# Patient Record
Sex: Male | Born: 1960
Health system: Southern US, Community
[De-identification: ages and names within clinical notes are randomized; demographics above are authoritative.]

## PROBLEM LIST (undated history)

## (undated) DIAGNOSIS — Z9289 Personal history of other medical treatment: Secondary | ICD-10-CM

## (undated) DIAGNOSIS — K219 Gastro-esophageal reflux disease without esophagitis: Secondary | ICD-10-CM

## (undated) DIAGNOSIS — M5430 Sciatica, unspecified side: Secondary | ICD-10-CM

## (undated) DIAGNOSIS — E785 Hyperlipidemia, unspecified: Secondary | ICD-10-CM

## (undated) DIAGNOSIS — G4733 Obstructive sleep apnea (adult) (pediatric): Secondary | ICD-10-CM

## (undated) HISTORY — DX: Gastro-esophageal reflux disease without esophagitis: K21.9

## (undated) HISTORY — DX: Personal history of other medical treatment: Z92.89

## (undated) HISTORY — PX: APPENDECTOMY: SHX54

## (undated) HISTORY — DX: Hyperlipidemia, unspecified: E78.5

## (undated) HISTORY — PX: LEG SURGERY: SHX1003

## (undated) HISTORY — DX: Obstructive sleep apnea (adult) (pediatric): G47.33

## (undated) HISTORY — DX: Sciatica, unspecified side: M54.30

---

## 2000-06-12 ENCOUNTER — Encounter: Admission: RE | Admit: 2000-06-12 | Discharge: 2000-09-10 | Payer: Self-pay | Admitting: *Deleted

## 2001-06-20 ENCOUNTER — Emergency Department (HOSPITAL_COMMUNITY): Admission: EM | Admit: 2001-06-20 | Discharge: 2001-06-20 | Payer: Self-pay | Admitting: Emergency Medicine

## 2003-10-28 ENCOUNTER — Ambulatory Visit (HOSPITAL_COMMUNITY): Admission: RE | Admit: 2003-10-28 | Discharge: 2003-10-28 | Payer: Self-pay | Admitting: Internal Medicine

## 2004-09-11 ENCOUNTER — Ambulatory Visit (HOSPITAL_COMMUNITY): Admission: RE | Admit: 2004-09-11 | Discharge: 2004-09-11 | Payer: Self-pay | Admitting: Internal Medicine

## 2004-10-25 ENCOUNTER — Ambulatory Visit (HOSPITAL_COMMUNITY): Admission: RE | Admit: 2004-10-25 | Discharge: 2004-10-25 | Payer: Self-pay | Admitting: Internal Medicine

## 2009-04-12 ENCOUNTER — Encounter: Payer: Self-pay | Admitting: Cardiology

## 2009-04-28 ENCOUNTER — Ambulatory Visit: Payer: Self-pay | Admitting: Cardiology

## 2009-04-28 DIAGNOSIS — E785 Hyperlipidemia, unspecified: Secondary | ICD-10-CM | POA: Insufficient documentation

## 2009-05-09 ENCOUNTER — Telehealth (INDEPENDENT_AMBULATORY_CARE_PROVIDER_SITE_OTHER): Payer: Self-pay

## 2009-05-10 ENCOUNTER — Ambulatory Visit: Payer: Self-pay

## 2009-05-10 ENCOUNTER — Ambulatory Visit: Payer: Self-pay | Admitting: Internal Medicine

## 2009-05-10 ENCOUNTER — Encounter (HOSPITAL_COMMUNITY): Admission: RE | Admit: 2009-05-10 | Discharge: 2009-07-12 | Payer: Self-pay | Admitting: Cardiology

## 2010-04-11 NOTE — Assessment & Plan Note (Signed)
Summary: Cardiology Nuclear Study  Nuclear Med Background Indications for Stress Test: Evaluation for Ischemia   History: Myocardial Perfusion Study  History Comments: 10 yrs ago: ok per patient.  Symptoms: Chest Pain, Chest Pain with Exertion    Nuclear Pre-Procedure Cardiac Risk Factors: Family History - CAD, History of Smoking, Lipids, NIDDM, RBBB Caffeine/Decaff Intake: None NPO After: 8:00 PM Lungs: clear IV 0.9% NS with Angio Cath: 22g     IV Site: (R) Hand IV Started by: Irean Hong RN Chest Size (in) 44     Height (in): 70 Weight (lb): 213 BMI: 30.67  Nuclear Med Study 1 or 2 day study:  1 day     Stress Test Type:  Stress Reading MD:  Arvilla Meres, MD     Referring MD:  D.Mclean Resting Radionuclide:  Technetium 73m Tetrofosmin     Resting Radionuclide Dose:  11.0 mCi  Stress Radionuclide:  Technetium 51m Tetrofosmin     Stress Radionuclide Dose:  33.0 mCi   Stress Protocol Exercise Time (min):  07:10 min     Max HR:  157 bpm     Predicted Max HR:  170 bpm  Max Systolic BP: 178 mm Hg     Percent Max HR:  92.35 %     METS: 8.8 Rate Pressure Product:  54098    Stress Test Technologist:  Milana Na EMT-P     Nuclear Technologist:  Burna Mortimer Deal RT-N  Rest Procedure  Myocardial perfusion imaging was performed at rest 45 minutes following the intravenous administration of Myoview Technetium 63m Tetrofosmin.  Stress Procedure  The patient exercised for 7:10. The patient stopped due to fatigue and denied any chest pain.  There were no significant ST-T wave changes.  Myoview was injected at peak exercise and myocardial perfusion imaging was performed after a brief delay.  QPS Raw Data Images:  Normal; no motion artifact; normal heart/lung ratio. Stress Images:  There is normal uptake in all areas. Rest Images:  Normal homogeneous uptake in all areas of the myocardium. Subtraction (SDS):  Normal Transient Ischemic Dilatation:  1.01  (Normal <1.22)  Lung/Heart Ratio:  .33  (Normal <0.45)  Quantitative Gated Spect Images QGS EDV:  92 ml QGS ESV:  29 ml QGS EF:  68 % QGS cine images:  Normal  Findings Normal nuclear study      Overall Impression  Exercise Capacity: Fair exercise capacity. ECG Impression: No significant ST segment change suggestive of ischemia. Overall Impression: Normal stress nuclear study. Overall Impression Comments: Normal.  Appended Document: Cardiology Nuclear Study normal  Appended Document: Cardiology Nuclear Study LMVM  Appended Document: Cardiology Nuclear Study discussed results with pt by telephone

## 2010-04-11 NOTE — Progress Notes (Signed)
Summary: Nuc. Pre-Procedure  Phone Note Outgoing Call Call back at [facility] Phone [phone]   Call placed by: Irean Hong, RN,  May 09, 2009 3:44 PM Summary of Call: Reviewed information on Myoview Information Sheet (see scanned document for further details).  Spoke with patient.     Nuclear Med Background Indications for Stress Test: Evaluation for Ischemia   History: Myocardial Perfusion Study  History Comments: 10 yrs ago: ok per patient.  Symptoms: Chest Pain, Chest Pain with Exertion    Nuclear Pre-Procedure Cardiac Risk Factors: Family History - CAD, History of Smoking, Lipids, NIDDM, RBBB Height (in): 70

## 2010-04-11 NOTE — Letter (Signed)
Summary: [facility]  [facility]   Imported By: Roderic Ovens 06/13/2009 13:53:28  _____________________________________________________________________  External Attachment:    Type:   Image     Comment:   External Document

## 2010-04-11 NOTE — Assessment & Plan Note (Signed)
Summary: np6/family hx of MI early 60's/cp/dm   Primary Provider:  Dr. Elisabeth Most   History of Present Illness: 50 yo with history of OSA, hyperlipidemia, and diet-controlled diabetes presents for evaluation of chest pain.  He is concerned because both his brother and sister have had MIs.  He gets crampy pain in left central chest.  Nothing in particular triggers this (sometimes with exertion, sometimes at rest).  Pain will last about 2 minutes at a time.  It has been going on for about a year.  He gets episodes every couple of weeks.  Of note, patient does walk 3-5 miles on "gazelle" machine several times a week with no chest pain or dyspnea.  He can climb steps and do manual labor, etc, without exertional shortness of breath.    ECG: NSR, iRBBB  Labs (2/11): K 3.9, creatinine 1.48, LDL 45, HDL 43, TSH normal  Current Medications (verified): 1)  Simvastatin 20 Mg Tabs (Simvastatin) .Marland Kitchen.. 1 By Mouth Daily 2)  Nexium 40 Mg Cpdr (Esomeprazole Magnesium) .Marland Kitchen.. 1 By Mouth As Needed 3)  Androgel Pump 1 % Gel (Testosterone) .... As Directed 4)  Aspirin 81 Mg Tbec (Aspirin) .... One Tablet Daily  Allergies (verified): No Known Drug Allergies  Past History:  Past Medical History: 1. CKD: Most recent creatinine 1.48. 2. Hyperlipidemia 3. Low testosterone 4. Sciatica 5. GERD 6. OSA 7. diet-controlled diabetes 8. ETT-cardiolite > 10 years ago was normal per patient's report.   Family History: Father with MI in his 42s, brother with MI at 66, sister with MI in her 38s  Social History: Quit smoking in 1988.  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  Married, lives in [place].   Review of Systems       All systems reviewed and negative except as per HPI.   Vital Signs:  Patient profile:   50 year old male Height:      70 inches Weight:      215 pounds BMI:     30.96 Pulse rate:   84 / minute Resp:     16 per minute BP sitting:   124 / 92  (right arm)  Vitals Entered By: Marrion Coy, CNA  (April 28, 2009 2:08 PM)  Physical Exam  General:  Well developed, well nourished, in no acute distress. Head:  normocephalic and atraumatic Nose:  no deformity, discharge, inflammation, or lesions Mouth:  Teeth, gums and palate normal. Oral mucosa normal. Neck:  Neck supple, no JVD. No masses, thyromegaly or abnormal cervical nodes. Lungs:  Clear bilaterally to auscultation and percussion. Heart:  Non-displaced PMI, chest non-tender; regular rate and rhythm, S1, S2 without murmurs, rubs or gallops. Carotid upstroke normal, no bruit.  Pedals normal pulses. No edema, no varicosities. Abdomen:  Bowel sounds positive; abdomen soft and non-tender without masses, organomegaly, or hernias noted. No hepatosplenomegaly. Msk:  Back normal, normal gait. Muscle strength and tone normal. Extremities:  No clubbing or cyanosis. Neurologic:  Alert and oriented x 3. Skin:  Intact without lesions or rashes. Psych:  Normal affect.   Impression & Recommendations:  Problem # 1:  CHEST PAIN UNSPECIFIED (ICD-786.50) Atypical chest pain in a patient with multiple risk factors, including hyperlipidemia, OSA, diabetes, and family history of CAD.  I will have him start ASA 81 mg daily and will risk stratify him with an ETT-myoview.    Problem # 2:  HYPERLIPIDEMIA-MIXED (ICD-272.4) Excellent lipids when checked this month. Continue Zocor.   Other Orders: Nuclear Stress Test (Nuc Stress Test)  Patient Instructions: 1)  Your physician has recommended you make the following change in your medication:  2)  Start Aspirin 81mg  daily--this should be buffered or coated 3)  Your physician has requested that you have an exercise stress myoview.  For further information please visit [email or web address]  Please follow instruction sheet, as given. 4)  Your physician recommends that you schedule a follow-up appointment as needed with Dr Rylynn Schoneman Shirlee Latch

## 2010-10-24 ENCOUNTER — Encounter: Payer: Self-pay | Admitting: Cardiology

## 2011-11-15 ENCOUNTER — Emergency Department (HOSPITAL_BASED_OUTPATIENT_CLINIC_OR_DEPARTMENT_OTHER): Payer: No Typology Code available for payment source

## 2011-11-15 ENCOUNTER — Emergency Department (HOSPITAL_BASED_OUTPATIENT_CLINIC_OR_DEPARTMENT_OTHER)
Admission: EM | Admit: 2011-11-15 | Discharge: 2011-11-15 | Disposition: A | Discharge status: Home / Self Care | Payer: No Typology Code available for payment source | Attending: Emergency Medicine | Admitting: Emergency Medicine

## 2011-11-15 ENCOUNTER — Encounter (HOSPITAL_BASED_OUTPATIENT_CLINIC_OR_DEPARTMENT_OTHER): Payer: Self-pay | Admitting: Emergency Medicine

## 2011-11-15 DIAGNOSIS — N189 Chronic kidney disease, unspecified: Secondary | ICD-10-CM | POA: Insufficient documentation

## 2011-11-15 DIAGNOSIS — S335XXA Sprain of ligaments of lumbar spine, initial encounter: Secondary | ICD-10-CM | POA: Insufficient documentation

## 2011-11-15 DIAGNOSIS — Z8249 Family history of ischemic heart disease and other diseases of the circulatory system: Secondary | ICD-10-CM | POA: Insufficient documentation

## 2011-11-15 DIAGNOSIS — G4733 Obstructive sleep apnea (adult) (pediatric): Secondary | ICD-10-CM | POA: Insufficient documentation

## 2011-11-15 DIAGNOSIS — S161XXA Strain of muscle, fascia and tendon at neck level, initial encounter: Secondary | ICD-10-CM

## 2011-11-15 DIAGNOSIS — S39012A Strain of muscle, fascia and tendon of lower back, initial encounter: Secondary | ICD-10-CM

## 2011-11-15 DIAGNOSIS — E119 Type 2 diabetes mellitus without complications: Secondary | ICD-10-CM | POA: Insufficient documentation

## 2011-11-15 DIAGNOSIS — S139XXA Sprain of joints and ligaments of unspecified parts of neck, initial encounter: Secondary | ICD-10-CM | POA: Insufficient documentation

## 2011-11-15 DIAGNOSIS — K219 Gastro-esophageal reflux disease without esophagitis: Secondary | ICD-10-CM | POA: Insufficient documentation

## 2011-11-15 MED ORDER — HYDROCODONE-ACETAMINOPHEN 5-325 MG PO TABS
2.0000 | ORAL_TABLET | Freq: Once | ORAL | Status: AC
  Administered 2011-11-15: 2 via ORAL
  Filled 2011-11-15: qty 2

## 2011-11-15 MED ORDER — HYDROCODONE-ACETAMINOPHEN 5-325 MG PO TABS: 2.0000 | ORAL_TABLET | ORAL | Status: AC | PRN
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
## 2011-11-24 NOTE — ED Provider Notes (Signed)
Medical screening examination/treatment/procedure(s) were performed by non-physician practitioner and as supervising physician I was immediately available for consultation/collaboration.  Kei Mcelhiney, MD 11/24/11 1437 

## 2013-05-27 ENCOUNTER — Encounter: Payer: Self-pay | Admitting: Internal Medicine

## 2013-06-03 ENCOUNTER — Encounter: Payer: Self-pay | Admitting: Internal Medicine

## 2013-06-03 ENCOUNTER — Ambulatory Visit (INDEPENDENT_AMBULATORY_CARE_PROVIDER_SITE_OTHER): Payer: PRIVATE HEALTH INSURANCE | Admitting: Internal Medicine

## 2013-06-03 VITALS — BP 114/76 | HR 92 | Temp 98.1°F | Resp 16 | Ht 70.0 in | Wt 238.6 lb

## 2013-06-03 DIAGNOSIS — J019 Acute sinusitis, unspecified: Secondary | ICD-10-CM

## 2013-06-03 DIAGNOSIS — E119 Type 2 diabetes mellitus without complications: Secondary | ICD-10-CM

## 2013-06-03 DIAGNOSIS — K2100 Gastro-esophageal reflux disease with esophagitis, without bleeding: Secondary | ICD-10-CM

## 2013-06-03 DIAGNOSIS — E559 Vitamin D deficiency, unspecified: Secondary | ICD-10-CM

## 2013-06-03 DIAGNOSIS — G4733 Obstructive sleep apnea (adult) (pediatric): Secondary | ICD-10-CM

## 2013-06-03 DIAGNOSIS — E669 Obesity, unspecified: Secondary | ICD-10-CM

## 2013-06-03 DIAGNOSIS — Z9989 Dependence on other enabling machines and devices: Secondary | ICD-10-CM

## 2013-06-03 DIAGNOSIS — E785 Hyperlipidemia, unspecified: Secondary | ICD-10-CM

## 2013-06-03 DIAGNOSIS — I10 Essential (primary) hypertension: Secondary | ICD-10-CM

## 2013-06-03 DIAGNOSIS — J029 Acute pharyngitis, unspecified: Secondary | ICD-10-CM

## 2013-06-03 DIAGNOSIS — E349 Endocrine disorder, unspecified: Secondary | ICD-10-CM

## 2013-06-03 MED ORDER — AZITHROMYCIN 250 MG PO TABS: ORAL_TABLET | ORAL | Status: AC

## 2013-06-03 MED ORDER — HYDROCODONE-ACETAMINOPHEN 5-325 MG PO TABS: ORAL_TABLET | ORAL | Status: DC

## 2013-06-03 MED ORDER — PREDNISONE 20 MG PO TABS: 20.0000 mg | ORAL_TABLET | ORAL | Status: DC

## 2013-06-03 NOTE — Progress Notes (Signed)
Subjective:    Patient ID: Erik Salas, male    DOB: 02/01/1961, [name] y.o.   MRN: [id number]  Cough This is a new problem. The current episode started in the past 7 days. The problem has been waxing and waning. The problem occurs constantly. The cough is non-productive. Associated symptoms include chills, a fever, headaches, nasal congestion, rhinorrhea, a sore throat and sweats. Pertinent negatives include no chest pain, ear congestion, ear pain, heartburn, hemoptysis, myalgias, postnasal drip, rash, shortness of breath, weight loss or wheezing. He has tried OTC cough suppressant for the symptoms. The treatment provided no relief.  Sinusitis Associated symptoms include chills, congestion, coughing, headaches, sinus pressure and a sore throat. Pertinent negatives include no diaphoresis, ear pain or shortness of breath.  Sore Throat  Associated symptoms include congestion, coughing and headaches. Pertinent negatives include no drooling, ear discharge, ear pain or shortness of breath.    Current Outpatient Prescriptions on File Prior to Visit  Medication Sig Dispense Refill  . aspirin 81 MG EC tablet Take 81 mg by mouth daily.        Marland Kitchen. esomeprazole (NEXIUM) 40 MG capsule Take 40 mg by mouth daily as needed.        . simvastatin (ZOCOR) 20 MG tablet Take 20 mg by mouth at bedtime.         No current facility-administered medications on file prior to visit.   No Known Allergies Past Medical History  Diagnosis Date  . CKD (chronic kidney disease)     most recent creatinine 1.48  . Hyperlipidemia   . Low testosterone   . Sciatica   . GERD (gastroesophageal reflux disease)   . OSA (obstructive sleep apnea)   . DM (diabetes mellitus), gestational, diet controlled   . History of ETT     cardiolite > 10 years ago was normal per patient's report    Review of Systems  Constitutional: Positive for fever and chills. Negative for weight loss, diaphoresis and fatigue.  HENT: Positive for  congestion, rhinorrhea, sinus pressure and sore throat. Negative for dental problem, drooling, ear discharge, ear pain, facial swelling, hearing loss, mouth sores, nosebleeds, postnasal drip and tinnitus.        C/o sinus pressure and pain.  Eyes: Negative.   Respiratory: Positive for cough. Negative for hemoptysis, shortness of breath and wheezing.   Cardiovascular: Negative.  Negative for chest pain.  Gastrointestinal: Negative.  Negative for heartburn.  Musculoskeletal: Negative for myalgias.  Skin: Negative for rash.  Neurological: Positive for headaches. Negative for dizziness, tremors, facial asymmetry, speech difficulty, weakness, light-headedness and numbness.   BP 114/76  Pulse 92  Temp(Src) 98.1 F (36.7 C) (Temporal)  Resp 16  Ht 5\' 10"  (1.778 m)  Wt 238 lb 9.6 oz (108.228 kg)  BMI 34.24 kg/m2    Objective:   Physical Exam  Constitutional: He is oriented to person, place, and time. No distress.  HENT:  Head: Normocephalic and atraumatic.  Right Ear: External ear normal.  Left Ear: External ear normal.  Nose: Nose normal.  Mouth/Throat: Oropharynx is clear and moist. No oropharyngeal exudate.  Bilat Frontal and maxillary tenderness. Oropharynx is sl red w/o exudate.  Eyes: Pupils are equal, round, and reactive to light. Right eye exhibits discharge. Left eye exhibits no discharge.  Neck: Normal range of motion. Neck supple. No JVD present. No thyromegaly present.  Cardiovascular: Normal rate, regular rhythm and normal heart sounds.   No murmur heard. Pulmonary/Chest: Effort normal and breath sounds  normal. No respiratory distress. He has no wheezes. He has no rales.  Abdominal: Soft. Bowel sounds are normal.  Musculoskeletal: Normal range of motion.  Lymphadenopathy:    He has no cervical adenopathy.  Neurological: He is alert and oriented to person, place, and time. No cranial nerve deficit. Coordination normal.  Skin: Skin is warm and dry. No rash noted. He is not  diaphoretic. No erythema. No pallor.   Assessment & Plan:  1. Acute sinusitis, unspecified  - azithromycin (ZITHROMAX) 250 MG tablet; Take 2 tablets (500 mg) on  Day 1,  followed by 1 tablet (250 mg) once daily on Days 2 through 5.  Dispense: 6 each; Refill: 1 - HYDROcodone-acetaminophen (NORCO) 5-325 MG per tablet; 1/2 to 1 tablet every 3 to 4 hours as  needed for cough or pain  Dispense: 50 tablet; Refill: 0 - predniSONE (DELTASONE) 20 MG tablet; Take 1 tablet (20 mg total) by mouth See admin instructions. 1 tab 3 x day for 3 days, then 1 tab 2 x day for 3 days, then 1 tab 1 x day for 5 days  Dispense: 20 tablet; Refill: 0  2. Acute pharyngitis

## 2013-06-03 NOTE — Patient Instructions (Signed)

## 2013-06-29 ENCOUNTER — Other Ambulatory Visit: Payer: Self-pay | Admitting: Internal Medicine

## 2013-07-21 ENCOUNTER — Other Ambulatory Visit: Payer: Self-pay | Admitting: Internal Medicine

## 2013-07-21 DIAGNOSIS — E119 Type 2 diabetes mellitus without complications: Secondary | ICD-10-CM

## 2013-07-21 MED ORDER — GLUCOSE BLOOD VI STRP: ORAL_STRIP | Status: DC

## 2013-08-06 ENCOUNTER — Encounter: Payer: Self-pay | Admitting: Internal Medicine

## 2013-09-14 ENCOUNTER — Encounter: Payer: Self-pay | Admitting: Internal Medicine

## 2013-10-27 ENCOUNTER — Encounter: Payer: Self-pay | Admitting: Internal Medicine

## 2013-10-27 ENCOUNTER — Ambulatory Visit (INDEPENDENT_AMBULATORY_CARE_PROVIDER_SITE_OTHER): Payer: 59 | Admitting: Internal Medicine

## 2013-10-27 VITALS — BP 112/72 | HR 76 | Temp 97.9°F | Resp 16 | Ht 70.0 in | Wt 198.2 lb

## 2013-10-27 DIAGNOSIS — Z9989 Dependence on other enabling machines and devices: Secondary | ICD-10-CM

## 2013-10-27 DIAGNOSIS — E559 Vitamin D deficiency, unspecified: Secondary | ICD-10-CM

## 2013-10-27 DIAGNOSIS — G4733 Obstructive sleep apnea (adult) (pediatric): Secondary | ICD-10-CM

## 2013-10-27 DIAGNOSIS — Z0000 Encounter for general adult medical examination without abnormal findings: Secondary | ICD-10-CM

## 2013-10-27 DIAGNOSIS — R7401 Elevation of levels of liver transaminase levels: Secondary | ICD-10-CM

## 2013-10-27 DIAGNOSIS — E349 Endocrine disorder, unspecified: Secondary | ICD-10-CM

## 2013-10-27 DIAGNOSIS — E119 Type 2 diabetes mellitus without complications: Secondary | ICD-10-CM

## 2013-10-27 DIAGNOSIS — R740 Nonspecific elevation of levels of transaminase and lactic acid dehydrogenase [LDH]: Secondary | ICD-10-CM

## 2013-10-27 DIAGNOSIS — E782 Mixed hyperlipidemia: Secondary | ICD-10-CM

## 2013-10-27 DIAGNOSIS — R7309 Other abnormal glucose: Secondary | ICD-10-CM

## 2013-10-27 DIAGNOSIS — K2100 Gastro-esophageal reflux disease with esophagitis, without bleeding: Secondary | ICD-10-CM

## 2013-10-27 DIAGNOSIS — Z113 Encounter for screening for infections with a predominantly sexual mode of transmission: Secondary | ICD-10-CM

## 2013-10-27 DIAGNOSIS — I10 Essential (primary) hypertension: Secondary | ICD-10-CM

## 2013-10-27 DIAGNOSIS — E669 Obesity, unspecified: Secondary | ICD-10-CM

## 2013-10-27 DIAGNOSIS — Z1212 Encounter for screening for malignant neoplasm of rectum: Secondary | ICD-10-CM

## 2013-10-27 DIAGNOSIS — Z125 Encounter for screening for malignant neoplasm of prostate: Secondary | ICD-10-CM

## 2013-10-27 DIAGNOSIS — E785 Hyperlipidemia, unspecified: Secondary | ICD-10-CM

## 2013-10-27 DIAGNOSIS — Z111 Encounter for screening for respiratory tuberculosis: Secondary | ICD-10-CM

## 2013-10-27 DIAGNOSIS — R7303 Prediabetes: Secondary | ICD-10-CM | POA: Insufficient documentation

## 2013-10-27 DIAGNOSIS — R7402 Elevation of levels of lactic acid dehydrogenase (LDH): Secondary | ICD-10-CM

## 2013-10-27 LAB — CBC WITH DIFFERENTIAL/PLATELET
BASOS ABS: 0.1 10*3/uL (ref 0.0–0.1)
BASOS PCT: 1 % (ref 0–1)
Eosinophils Absolute: 0.1 10*3/uL (ref 0.0–0.7)
Eosinophils Relative: 2 % (ref 0–5)
HEMATOCRIT: 46.1 % (ref 39.0–52.0)
HEMOGLOBIN: 16.1 g/dL (ref 13.0–17.0)
Lymphocytes Relative: 36 % (ref 12–46)
Lymphs Abs: 2.2 10*3/uL (ref 0.7–4.0)
MCH: 31 pg (ref 26.0–34.0)
MCHC: 34.9 g/dL (ref 30.0–36.0)
MCV: 88.8 fL (ref 78.0–100.0)
Monocytes Absolute: 0.6 10*3/uL (ref 0.1–1.0)
Monocytes Relative: 10 % (ref 3–12)
NEUTROS PCT: 51 % (ref 43–77)
Neutro Abs: 3.1 10*3/uL (ref 1.7–7.7)
PLATELETS: 170 10*3/uL (ref 150–400)
RBC: 5.19 MIL/uL (ref 4.22–5.81)
RDW: 14.3 % (ref 11.5–15.5)
WBC: 6.1 10*3/uL (ref 4.0–10.5)

## 2013-10-27 NOTE — Progress Notes (Signed)
Patient ID: Erik Salas, male   DOB: 17-Jan-1961, 53 y.o.   MRN: [id number]   Annual Screening Comprehensive Examination  This very nice 52 y.o.male presents for complete physical.  Patient has been followed for HTN, T2_NIDDM, Hyperlipidemia, and Vitamin D Deficiency. Patient has followed Dr Francis Dowse Fuhrman's "Eat to Live" diet and started exercising wand he has lost 44# in the last year with his BMI dropping from 36 to his current 28.4 . He reports that he has stopped his CPAP with no recurrance of Sleep Apnea. gerd sX'S HAVE IMPROVED WITH ONLY INFREQUENT USE OF HIS nEXIUM.    Labile HTN predates since 2013 an dhas been monitored expectantly.  Patient's BP has been controlled at home.Today's BP: 112/72 mmHg. Patient denies any cardiac symptoms as chest pain, palpitations, shortness of breath, dizziness or ankle swelling.   Patient's hyperlipidemia is controlled with diet and medications. Patient denies myalgias or other medication SE's. Last lipids were Chol 225, TG 264, HDL 64 and LDL 42 in Mar 2014.    Patient has prediabetes since 2012 with A1c 5.7  and patient denies reactive hypoglycemic symptoms, visual blurring, diabetic polys or paresthesias. Last A1c was 5.6% in Mar 2014.    Finally, patient has history of Vitamin D Deficiency of 44 in 2009 and last vitamin D was 45 in Mar 2014.   Medication Sig  . aspirin 81 MG EC tablet Take 81 mg by mouth daily.    Marland Kitchen esomeprazole (NEXIUM) 40 MG capsule Take 40 mg by mouth daily as needed.    Marland Kitchen glucose blood test strip Use as instructed (One Touch Ultra Teststrips) - check glucose tid  . simvastatin (ZOCOR) 40 MG tablet TAKE ONE TABLET BY MOUTH AT BEDTIME   No Known Allergies  Past Medical History  Diagnosis Date  . CKD (chronic kidney disease)     most recent creatinine 1.48  . Hyperlipidemia   . Low testosterone   . Sciatica   . GERD (gastroesophageal reflux disease)   . OSA (obstructive sleep apnea)   . DM (diabetes mellitus), gestational,  diet controlled   . History of ETT     cardiolite > 10 years ago was normal per patient's report   Past Surgical History  Procedure Laterality Date  . Appendectomy     Family History  Problem Relation Age of Onset  . Heart attack Father 9  . Heart attack Brother 67  . Heart attack Sister 69   History   Social History  . Marital Status: Married    Spouse Name: [name]    Number of Children: [name]  . Years of Education: [name]   Occupational History  . Parts Manager     [employer]   Social History Main Topics  . Smoking status: Former Smoker    Quit date: 03/12/1986  . Smokeless tobacco: Not on file  . Alcohol Use: Yes     Comment: ocassional  . Drug Use: Not on file  . Sexual Activity: Not on file   Social History Narrative   Lives in [place]    ROS Constitutional: Denies fever, chills, weight loss/gain, headaches, insomnia, fatigue, night sweats or change in appetite. Eyes: Denies redness, blurred vision, diplopia, discharge, itchy or watery eyes.  ENT: Denies discharge, congestion, post nasal drip, epistaxis, sore throat, earache, hearing loss, dental pain, Tinnitus, Vertigo, Sinus pain or snoring.  Cardio: Denies chest pain, palpitations, irregular heartbeat, syncope, dyspnea, diaphoresis, orthopnea, PND, claudication or edema Respiratory: denies cough, dyspnea, DOE, pleurisy,  hoarseness, laryngitis or wheezing.  Gastrointestinal: Denies dysphagia, heartburn, reflux, water brash, pain, cramps, nausea, vomiting, bloating, diarrhea, constipation, hematemesis, melena, hematochezia, jaundice or hemorrhoids Genitourinary: Denies dysuria, frequency, urgency, nocturia, hesitancy, discharge, hematuria or flank pain Musculoskeletal: Denies arthralgia, myalgia, stiffness, Jt. Swelling, pain, limp or strain/sprain. Denies Falls. Skin: Denies puritis, rash, hives, warts, acne, eczema or change in skin lesion Neuro: No weakness, tremor, incoordination, spasms, paresthesia or  pain Psychiatric: Denies confusion, memory loss or sensory loss. Denies Depression. Endocrine: Denies change in weight, skin, hair change, nocturia, and paresthesia, diabetic polys, visual blurring or hyper / hypo glycemic episodes.  Heme/Lymph: No excessive bleeding, bruising or enlarged lymph nodes.  Physical Exam  BP 112/72  Pulse 76  Temp(Src) 97.9 F (36.6 C) (Temporal)  Resp 16  Ht 5\' 10"  (1.778 m)  Wt 198 lb 3.2 oz (89.903 kg)  BMI 28.44 kg/m2  General Appearance: Well nourished, in no apparent distress. Eyes: PERRLA, EOMs, conjunctiva no swelling or erythema, normal fundi and vessels. Sinuses: No frontal/maxillary tenderness ENT/Mouth: EACs patent / TMs  nl. Nares clear without erythema, swelling, mucoid exudates. Oral hygiene is good. No erythema, swelling, or exudate. Tongue normal, non-obstructing. Tonsils not swollen or erythematous. Hearing normal.  Neck: Supple, thyroid normal. No bruits, nodes or JVD. Respiratory: Respiratory effort normal.  BS equal and clear bilateral without rales, rhonci, wheezing or stridor. Cardio: Heart sounds are normal with regular rate and rhythm and no murmurs, rubs or gallops. Peripheral pulses are normal and equal bilaterally without edema. No aortic or femoral bruits. Chest: symmetric with normal excursions and percussion.  Abdomen: Flat, soft, with bowl sounds. Nontender, no guarding, rebound, hernias, masses, or organomegaly.  Lymphatics: Non tender without lymphadenopathy.  Genitourinary: No hernias.Testes nl. DRE - prostate nl for age - smooth & firm w/o nodules. Musculoskeletal: Full ROM all peripheral extremities, joint stability, 5/5 strength, and normal gait. Skin: Warm and dry without rashes, lesions, cyanosis, clubbing or  ecchymosis.  Neuro: Cranial nerves intact, reflexes equal bilaterally. Normal muscle tone, no cerebellar symptoms. Sensation intact.  Pysch: Awake and oriented X 3with normal affect, insight and judgment  appropriate.  Assessment and Plan  1. Annual Screening Examination 2. Labile Hypertension, Hx 3. Hyperlipidemia 4. PreDiabetes 5. Vitamin D Deficiency 6. OSA/CPAP - off Tx  7. MORBID OBESITY (BMI 36 -> 28.4)  Continue prudent diet as discussed, weight control, BP monitoring, regular exercise, and medications as discussed.  Discussed med effects and SE's. Routine screening labs and tests as requested with regular follow-up as recommended.

## 2013-10-27 NOTE — Patient Instructions (Signed)
Recommend the book "The END of DIETING" by Dr Baker Janus   and the book "The END of DIABETES " by Dr Excell Seltzer  At [email or web address] - get book & Audio CD's      Being diabetic has a  300% increased risk for heart attack, stroke, cancer, and alzheimer- type vascular dementia. It is very important that you work harder with diet by avoiding all foods that are white except chicken & fish. Avoid white rice (brown & wild rice is OK), white potatoes (sweetpotatoes in moderation is OK), White bread or wheat bread or anything made out of white flour like bagels, donuts, rolls, buns, biscuits, cakes, pastries, cookies, pizza crust, and pasta (made from white flour & egg whites) - vegetarian pasta or spinach or wheat pasta is OK. Multigrain breads like Arnold's or [employer], or multigrain sandwich thins or flatbreads.  Diet, exercise and weight loss can reverse and cure diabetes in the early stages.  Diet, exercise and weight loss is very important in the control and prevention of complications of diabetes which affects every system in your body, ie. Brain - dementia/stroke, eyes - glaucoma/blindness, heart - heart attack/heart failure, kidneys - dialysis, stomach - gastric paralysis, intestines - malabsorption, nerves - severe painful neuritis, circulation - gangrene & loss of a leg(s), and finally cancer and Alzheimers.    I recommend avoid fried & greasy foods,  sweets/candy, white rice (brown or wild rice or Quinoa is OK), white potatoes (sweet potatoes are OK) - anything made from white flour - bagels, doughnuts, rolls, buns, biscuits,white and wheat breads, pizza crust and traditional pasta made of white flour & egg white(vegetarian pasta or spinach or wheat pasta is OK).  Multi-grain bread is OK - like multi-grain flat bread or sandwich thins. Avoid alcohol in excess. Exercise is also important.    Eat all the vegetables you want - avoid meat, especially red meat and dairy - especially cheese.  Cheese  is the most concentrated form of trans-fats which is the worst thing to clog up our arteries. Veggie cheese is OK which can be found in the fresh produce section at [employer] or [employer] or [employer]  Preventive Care for Adults A healthy lifestyle and preventive care can promote health and wellness. Preventive health guidelines for men include the following key practices:  A routine yearly physical is a good way to check with your health care provider about your health and preventative screening. It is a chance to share any concerns and updates on your health and to receive a thorough exam.  Visit your dentist for a routine exam and preventative care every 6 months. Brush your teeth twice a day and floss once a day. Good oral hygiene prevents tooth decay and gum disease.  The frequency of eye exams is based on your age, health, family medical history, use of contact lenses, and other factors. Follow your health care provider's recommendations for frequency of eye exams.  Eat a healthy diet. Foods such as vegetables, fruits, whole grains, low-fat dairy products, and lean protein foods contain the nutrients you need without too many calories. Decrease your intake of foods high in solid fats, added sugars, and salt. Eat the right amount of calories for you.Get information about a proper diet from your health care provider, if necessary.  Regular physical exercise is one of the most important things you can do for your health. Most adults should get at least 150 minutes of moderate-intensity exercise (any activity that  increases your heart rate and causes you to sweat) each week. In addition, most adults need muscle-strengthening exercises on 2 or more days a week.  Maintain a healthy weight. The body mass index (BMI) is a screening tool to identify possible weight problems. It provides an estimate of body fat based on height and weight. Your health care provider can find your BMI and can help you  achieve or maintain a healthy weight.For adults 20 years and older:  A BMI below 18.5 is considered underweight.  A BMI of 18.5 to 24.9 is normal.  A BMI of 25 to 29.9 is considered overweight.  A BMI of 30 and above is considered obese.  Maintain normal blood lipids and cholesterol levels by exercising and minimizing your intake of saturated fat. Eat a balanced diet with plenty of fruit and vegetables. Blood tests for lipids and cholesterol should begin at age 20 and be repeated every 5 years. If your lipid or cholesterol levels are high, you are over 50, or you are at high risk for heart disease, you may need your cholesterol levels checked more frequently.Ongoing high lipid and cholesterol levels should be treated with medicines if diet and exercise are not working.  If you smoke, find out from your health care provider how to quit. If you do not use tobacco, do not start.  Lung cancer screening is recommended for adults aged 72-80 years who are at high risk for developing lung cancer because of a history of smoking. A yearly low-dose CT scan of the lungs is recommended for people who have at least a 30-pack-year history of smoking and are a current smoker or have quit within the past 15 years. A pack year of smoking is smoking an average of 1 pack of cigarettes a day for 1 year (for example: 1 pack a day for 30 years or 2 packs a day for 15 years). Yearly screening should continue until the smoker has stopped smoking for at least 15 years. Yearly screening should be stopped for people who develop a health problem that would prevent them from having lung cancer treatment.  If you choose to drink alcohol, do not have more than 2 drinks per day. One drink is considered to be 12 ounces (355 mL) of beer, 5 ounces (148 mL) of wine, or 1.5 ounces (44 mL) of liquor.  Avoid use of street drugs. Do not share needles with anyone. Ask for help if you need support or instructions about stopping the use of  drugs.  High blood pressure causes heart disease and increases the risk of stroke. Your blood pressure should be checked at least every 1-2 years. Ongoing high blood pressure should be treated with medicines, if weight loss and exercise are not effective.  If you are 28-64 years old, ask your health care provider if you should take aspirin to prevent heart disease.  Diabetes screening involves taking a blood sample to check your fasting blood sugar level. This should be done once every 3 years, after age 13, if you are within normal weight and without risk factors for diabetes. Testing should be considered at a younger age or be carried out more frequently if you are overweight and have at least 1 risk factor for diabetes.  Colorectal cancer can be detected and often prevented. Most routine colorectal cancer screening begins at the age of 78 and continues through age 56. However, your health care provider may recommend screening at an earlier age if you have risk  factors for colon cancer. On a yearly basis, your health care provider may provide home test kits to check for hidden blood in the stool. Use of a small camera at the end of a tube to directly examine the colon (sigmoidoscopy or colonoscopy) can detect the earliest forms of colorectal cancer. Talk to your health care provider about this at age 48, when routine screening begins. Direct exam of the colon should be repeated every 5-10 years through age 60, unless early forms of precancerous polyps or small growths are found.  People who are at an increased risk for hepatitis B should be screened for this virus. You are considered at high risk for hepatitis B if:  You were born in a country where hepatitis B occurs often. Talk with your health care provider about which countries are considered high risk.  Your parents were born in a high-risk country and you have not received a shot to protect against hepatitis B (hepatitis B vaccine).  You have  HIV or AIDS.  You use needles to inject street drugs.  You live with, or have sex with, someone who has hepatitis B.  You are a man who has sex with other men (MSM).  You get hemodialysis treatment.  You take certain medicines for conditions such as cancer, organ transplantation, and autoimmune conditions.  Hepatitis C blood testing is recommended for all people born from [birth year] through 1965 and any individual with known risks for hepatitis C.  Practice safe sex. Use condoms and avoid high-risk sexual practices to reduce the spread of sexually transmitted infections (STIs). STIs include gonorrhea, chlamydia, syphilis, trichomonas, herpes, HPV, and human immunodeficiency virus (HIV). Herpes, HIV, and HPV are viral illnesses that have no cure. They can result in disability, cancer, and death.  If you are at risk of being infected with HIV, it is recommended that you take a prescription medicine daily to prevent HIV infection. This is called preexposure prophylaxis (PrEP). You are considered at risk if:  You are a man who has sex with other men (MSM) and have other risk factors.  You are a heterosexual man, are sexually active, and are at increased risk for HIV infection.  You take drugs by injection.  You are sexually active with a partner who has HIV.  Talk with your health care provider about whether you are at high risk of being infected with HIV. If you choose to begin PrEP, you should first be tested for HIV. You should then be tested every 3 months for as long as you are taking PrEP.  A one-time screening for abdominal aortic aneurysm (AAA) and surgical repair of large AAAs by ultrasound are recommended for men ages 51 to 11 years who are current or former smokers.  Healthy men should no longer receive prostate-specific antigen (PSA) blood tests as part of routine cancer screening. Talk with your health care provider about prostate cancer screening.  Testicular cancer screening is  not recommended for adult males who have no symptoms. Screening includes self-exam, a health care provider exam, and other screening tests. Consult with your health care provider about any symptoms you have or any concerns you have about testicular cancer.  Use sunscreen. Apply sunscreen liberally and repeatedly throughout the day. You should seek shade when your shadow is shorter than you. Protect yourself by wearing long sleeves, pants, a wide-brimmed hat, and sunglasses year round, whenever you are outdoors.  Once a month, do a whole-body skin exam, using a mirror to look  at the skin on your back. Tell your health care provider about new moles, moles that have irregular borders, moles that are larger than a pencil eraser, or moles that have changed in shape or color.  Stay current with required vaccines (immunizations).  Influenza vaccine. All adults should be immunized every year.  Tetanus, diphtheria, and acellular pertussis (Td, Tdap) vaccine. An adult who has not previously received Tdap or who does not know his vaccine status should receive 1 dose of Tdap. This initial dose should be followed by tetanus and diphtheria toxoids (Td) booster doses every 10 years. Adults with an unknown or incomplete history of completing a 3-dose immunization series with Td-containing vaccines should begin or complete a primary immunization series including a Tdap dose. Adults should receive a Td booster every 10 years.  Varicella vaccine. An adult without evidence of immunity to varicella should receive 2 doses or a second dose if he has previously received 1 dose.  Human papillomavirus (HPV) vaccine. Males aged 29-21 years who have not received the vaccine previously should receive the 3-dose series. Males aged 22-26 years may be immunized. Immunization is recommended through the age of 26 years for any male who has sex with males and did not get any or all doses earlier. Immunization is recommended for any  person with an immunocompromised condition through the age of 29 years if he did not get any or all doses earlier. During the 3-dose series, the second dose should be obtained 4-8 weeks after the first dose. The third dose should be obtained 24 weeks after the first dose and 16 weeks after the second dose.  Zoster vaccine. One dose is recommended for adults aged 38 years or older unless certain conditions are present.  Measles, mumps, and rubella (MMR) vaccine. Adults born before [birth year] generally are considered immune to measles and mumps. Adults born in [birth year] or later should have 1 or more doses of MMR vaccine unless there is a contraindication to the vaccine or there is laboratory evidence of immunity to each of the three diseases. A routine second dose of MMR vaccine should be obtained at least 28 days after the first dose for students attending postsecondary schools, health care workers, or international travelers. People who received inactivated measles vaccine or an unknown type of measles vaccine during 1963-1967 should receive 2 doses of MMR vaccine. People who received inactivated mumps vaccine or an unknown type of mumps vaccine before 1979 and are at high risk for mumps infection should consider immunization with 2 doses of MMR vaccine. Unvaccinated health care workers born before [birth year] who lack laboratory evidence of measles, mumps, or rubella immunity or laboratory confirmation of disease should consider measles and mumps immunization with 2 doses of MMR vaccine or rubella immunization with 1 dose of MMR vaccine.  Pneumococcal 13-valent conjugate (PCV13) vaccine. When indicated, a person who is uncertain of his immunization history and has no record of immunization should receive the PCV13 vaccine. An adult aged 68 years or older who has certain medical conditions and has not been previously immunized should receive 1 dose of PCV13 vaccine. This PCV13 should be followed with a dose of pneumococcal  polysaccharide (PPSV23) vaccine. The PPSV23 vaccine dose should be obtained at least 8 weeks after the dose of PCV13 vaccine. An adult aged 95 years or older who has certain medical conditions and previously received 1 or more doses of PPSV23 vaccine should receive 1 dose of PCV13. The PCV13 vaccine dose should be obtained 1  or more years after the last PPSV23 vaccine dose.  Pneumococcal polysaccharide (PPSV23) vaccine. When PCV13 is also indicated, PCV13 should be obtained first. All adults aged 10 years and older should be immunized. An adult younger than age 43 years who has certain medical conditions should be immunized. Any person who resides in a nursing home or long-term care facility should be immunized. An adult smoker should be immunized. People with an immunocompromised condition and certain other conditions should receive both PCV13 and PPSV23 vaccines. People with human immunodeficiency virus (HIV) infection should be immunized as soon as possible after diagnosis. Immunization during chemotherapy or radiation therapy should be avoided. Routine use of PPSV23 vaccine is not recommended for American Indians, [place]atives, or people younger than 65 years unless there are medical conditions that require PPSV23 vaccine. When indicated, people who have unknown immunization and have no record of immunization should receive PPSV23 vaccine. One-time revaccination 5 years after the first dose of PPSV23 is recommended for people aged 19-64 years who have chronic kidney failure, nephrotic syndrome, asplenia, or immunocompromised conditions. People who received 1-2 doses of PPSV23 before age 43 years should receive another dose of PPSV23 vaccine at age 43 years or later if at least 5 years have passed since the previous dose. Doses of PPSV23 are not needed for people immunized with PPSV23 at or after age 63 years.  Meningococcal vaccine. Adults with asplenia or persistent complement component deficiencies  should receive 2 doses of quadrivalent meningococcal conjugate (MenACWY-D) vaccine. The doses should be obtained at least 2 months apart. Microbiologists working with certain meningococcal bacteria, [place] recruits, people at risk during an outbreak, and people who travel to or live in countries with a high rate of meningitis should be immunized. A first-year college student up through age 70 years who is living in a residence hall should receive a dose if he did not receive a dose on or after his 16th birthday. Adults who have certain high-risk conditions should receive one or more doses of vaccine.  Hepatitis A vaccine. Adults who wish to be protected from this disease, have certain high-risk conditions, work with hepatitis A-infected animals, work in hepatitis A research labs, or travel to or work in countries with a high rate of hepatitis A should be immunized. Adults who were previously unvaccinated and who anticipate close contact with an international adoptee during the first 60 days after arrival in the [place] from a country with a high rate of hepatitis A should be immunized.  Hepatitis B vaccine. Adults should be immunized if they wish to be protected from this disease, have certain high-risk conditions, may be exposed to blood or other infectious body fluids, are household contacts or sex partners of hepatitis B positive people, are clients or workers in certain care facilities, or travel to or work in countries with a high rate of hepatitis B.  Haemophilus influenzae type b (Hib) vaccine. A previously unvaccinated person with asplenia or sickle cell disease or having a scheduled splenectomy should receive 1 dose of Hib vaccine. Regardless of previous immunization, a recipient of a hematopoietic stem cell transplant should receive a 3-dose series 6-12 months after his successful transplant. Hib vaccine is not recommended for adults with HIV infection. Preventive Service / Frequency Ages  76 to 70  Blood pressure check.** / Every 1 to 2 years.  Lipid and cholesterol check.** / Every 5 years beginning at age 32.  Lung cancer screening. / Every year if you are aged 85-80  years and have a 30-pack-year history of smoking and currently smoke or have quit within the past 15 years. Yearly screening is stopped once you have quit smoking for at least 15 years or develop a health problem that would prevent you from having lung cancer treatment.  Fecal occult blood test (FOBT) of stool. / Every year beginning at age 50 and continuing until age 69. You may not have to do this test if you get a colonoscopy every 10 years.  Flexible sigmoidoscopy** or colonoscopy.** / Every 5 years for a flexible sigmoidoscopy or every 10 years for a colonoscopy beginning at age 19 and continuing until age 73.  Hepatitis C blood test.** / For all people born from [birth year] through 1965 and any individual with known risks for hepatitis C.  Skin self-exam. / Monthly.  Influenza vaccine. / Every year.  Tetanus, diphtheria, and acellular pertussis (Tdap/Td) vaccine.** / Consult your health care provider. 1 dose of Td every 10 years.  Varicella vaccine.** / Consult your health care provider.  Zoster vaccine.** / 1 dose for adults aged 53 years or older.  Measles, mumps, rubella (MMR) vaccine.** / You need at least 1 dose of MMR if you were born in 1957 or later. You may also need a second dose.  Pneumococcal 13-valent conjugate (PCV13) vaccine.** / Consult your health care provider.  Pneumococcal polysaccharide (PPSV23) vaccine.** / 1 to 2 doses if you smoke cigarettes or if you have certain conditions.  Meningococcal vaccine.** / Consult your health care provider.  Hepatitis A vaccine.** / Consult your health care provider.  Hepatitis B vaccine.** / Consult your health care provider.  Haemophilus influenzae type b (Hib) vaccine.** / Consult your health care provider.

## 2013-10-28 LAB — HEPATITIS C ANTIBODY: HCV AB: NEGATIVE

## 2013-10-28 LAB — IRON AND TIBC
%SAT: 25 % (ref 20–55)
IRON: 90 ug/dL (ref 42–165)
TIBC: 355 ug/dL (ref 215–435)
UIBC: 265 ug/dL (ref 125–400)

## 2013-10-28 LAB — HEPATITIS B CORE ANTIBODY, TOTAL: HEP B C TOTAL AB: NONREACTIVE

## 2013-10-28 LAB — HEPATIC FUNCTION PANEL
ALK PHOS: 139 U/L — AB (ref 39–117)
ALT: 18 U/L (ref 0–53)
AST: 22 U/L (ref 0–37)
Albumin: 4.8 g/dL (ref 3.5–5.2)
BILIRUBIN TOTAL: 1 mg/dL (ref 0.2–1.2)
Bilirubin, Direct: 0.2 mg/dL (ref 0.0–0.3)
Indirect Bilirubin: 0.8 mg/dL (ref 0.2–1.2)
Total Protein: 7.5 g/dL (ref 6.0–8.3)

## 2013-10-28 LAB — BASIC METABOLIC PANEL WITH GFR
BUN: 14 mg/dL (ref 6–23)
CALCIUM: 9.8 mg/dL (ref 8.4–10.5)
CHLORIDE: 101 meq/L (ref 96–112)
CO2: 30 mEq/L (ref 19–32)
Creat: 0.91 mg/dL (ref 0.50–1.35)
GFR, Est African American: >89 mL/min
Glucose, Bld: 93 mg/dL (ref 70–99)
POTASSIUM: 4.3 meq/L (ref 3.5–5.3)
SODIUM: 139 meq/L (ref 135–145)

## 2013-10-28 LAB — PSA: PSA: 0.67 ng/mL (ref <=4.00)

## 2013-10-28 LAB — URINALYSIS, MICROSCOPIC ONLY
BACTERIA UA: NONE SEEN
Casts: NONE SEEN
Crystals: NONE SEEN
Squamous Epithelial / LPF: NONE SEEN

## 2013-10-28 LAB — MICROALBUMIN / CREATININE URINE RATIO
CREATININE, URINE: 195.6 mg/dL
MICROALB UR: 0.5 mg/dL (ref 0.00–1.89)
MICROALB/CREAT RATIO: 2.6 mg/g (ref 0.0–30.0)

## 2013-10-28 LAB — LIPID PANEL
CHOL/HDL RATIO: 4.5 ratio
CHOLESTEROL: 226 mg/dL — AB (ref 0–200)
HDL: 50 mg/dL (ref >39)
LDL CALC: 139 mg/dL — AB (ref 0–99)
Triglycerides: 187 mg/dL — ABNORMAL HIGH (ref <150)
VLDL: 37 mg/dL (ref 0–40)

## 2013-10-28 LAB — TESTOSTERONE: Testosterone: 520 ng/dL (ref 300–890)

## 2013-10-28 LAB — HEPATITIS A ANTIBODY, TOTAL: HEP A TOTAL AB: REACTIVE — AB

## 2013-10-28 LAB — MAGNESIUM: MAGNESIUM: 2.3 mg/dL (ref 1.5–2.5)

## 2013-10-28 LAB — HEPATITIS B SURFACE ANTIBODY,QUALITATIVE: HEP B S AB: NEGATIVE

## 2013-10-28 LAB — HEMOGLOBIN A1C
Hgb A1c MFr Bld: 5.5 % (ref <5.7)
MEAN PLASMA GLUCOSE: 111 mg/dL (ref <117)

## 2013-10-28 LAB — RPR

## 2013-10-28 LAB — INSULIN, FASTING: INSULIN FASTING, SERUM: 9 u[IU]/mL (ref 3–28)

## 2013-10-28 LAB — VITAMIN B12: Vitamin B-12: 587 pg/mL (ref 211–911)

## 2013-10-28 LAB — VITAMIN D 25 HYDROXY (VIT D DEFICIENCY, FRACTURES): Vit D, 25-Hydroxy: 74 ng/mL (ref 30–89)

## 2013-10-28 LAB — TSH: TSH: 1.709 u[IU]/mL (ref 0.350–4.500)

## 2013-10-28 LAB — HIV ANTIBODY (ROUTINE TESTING W REFLEX): HIV 1&2 Ab, 4th Generation: NONREACTIVE

## 2013-10-29 LAB — HEPATITIS B E ANTIBODY: HEPATITIS BE ANTIBODY: NONREACTIVE

## 2013-10-30 LAB — TB SKIN TEST
Induration: 0 mm
TB Skin Test: NEGATIVE

## 2013-11-01 ENCOUNTER — Other Ambulatory Visit: Payer: Self-pay | Admitting: Internal Medicine

## 2013-11-01 DIAGNOSIS — E782 Mixed hyperlipidemia: Secondary | ICD-10-CM

## 2013-11-01 MED ORDER — ATORVASTATIN CALCIUM 80 MG PO TABS: ORAL_TABLET | ORAL | Status: DC

## 2014-02-12 ENCOUNTER — Ambulatory Visit: Payer: Self-pay | Admitting: Physician Assistant

## 2014-02-15 ENCOUNTER — Other Ambulatory Visit: Payer: Self-pay | Admitting: *Deleted

## 2014-02-15 MED ORDER — ESOMEPRAZOLE MAGNESIUM 40 MG PO CPDR: 40.0000 mg | DELAYED_RELEASE_CAPSULE | Freq: Every day | ORAL | Status: DC | PRN

## 2014-05-20 ENCOUNTER — Ambulatory Visit (INDEPENDENT_AMBULATORY_CARE_PROVIDER_SITE_OTHER): Payer: 59 | Admitting: Internal Medicine

## 2014-05-20 ENCOUNTER — Encounter: Payer: Self-pay | Admitting: Internal Medicine

## 2014-05-20 ENCOUNTER — Encounter (INDEPENDENT_AMBULATORY_CARE_PROVIDER_SITE_OTHER): Payer: Self-pay

## 2014-05-20 VITALS — BP 122/70 | HR 88 | Temp 98.2°F | Resp 16 | Ht 70.0 in | Wt 209.0 lb

## 2014-05-20 DIAGNOSIS — Z79899 Other long term (current) drug therapy: Secondary | ICD-10-CM | POA: Insufficient documentation

## 2014-05-20 DIAGNOSIS — E785 Hyperlipidemia, unspecified: Secondary | ICD-10-CM

## 2014-05-20 DIAGNOSIS — I10 Essential (primary) hypertension: Secondary | ICD-10-CM

## 2014-05-20 DIAGNOSIS — R7309 Other abnormal glucose: Secondary | ICD-10-CM

## 2014-05-20 DIAGNOSIS — R7303 Prediabetes: Secondary | ICD-10-CM

## 2014-05-20 DIAGNOSIS — E559 Vitamin D deficiency, unspecified: Secondary | ICD-10-CM

## 2014-05-20 LAB — CBC WITH DIFFERENTIAL/PLATELET
BASOS ABS: 0.1 10*3/uL (ref 0.0–0.1)
Basophils Relative: 1 % (ref 0–1)
EOS ABS: 0.2 10*3/uL (ref 0.0–0.7)
Eosinophils Relative: 3 % (ref 0–5)
HCT: 44.5 % (ref 39.0–52.0)
HEMOGLOBIN: 15.1 g/dL (ref 13.0–17.0)
Lymphocytes Relative: 42 % (ref 12–46)
Lymphs Abs: 2.1 10*3/uL (ref 0.7–4.0)
MCH: 30.6 pg (ref 26.0–34.0)
MCHC: 33.9 g/dL (ref 30.0–36.0)
MCV: 90.3 fL (ref 78.0–100.0)
MONOS PCT: 10 % (ref 3–12)
MPV: 11 fL (ref 8.6–12.4)
Monocytes Absolute: 0.5 10*3/uL (ref 0.1–1.0)
NEUTROS ABS: 2.2 10*3/uL (ref 1.7–7.7)
Neutrophils Relative %: 44 % (ref 43–77)
Platelets: 171 10*3/uL (ref 150–400)
RBC: 4.93 MIL/uL (ref 4.22–5.81)
RDW: 14.4 % (ref 11.5–15.5)
WBC: 5 10*3/uL (ref 4.0–10.5)

## 2014-05-20 NOTE — Progress Notes (Addendum)
Patient ID: Erik Salas, male   DOB: 09/24/1960, 54 y.o.   MRN: [id number]  Assessment and Plan:  Hypertension:  -Continue medication,  -monitor blood pressure at home.  -Continue DASH diet.   -Reminder to go to the ER if any CP, SOB, nausea, dizziness, severe HA, changes vision/speech, left arm numbness and tingling, and jaw pain.  Cholesterol: -Continue diet and exercise.  -Check cholesterol.   Pre-diabetes: -Continue diet and exercise.  -Check A1C  Vitamin D Def: -check level -continue medications.   Continue diet and meds as discussed. Further disposition pending results of labs.  HPI 54 y.o. male  presents for 3 month follow up with hypertension, hyperlipidemia, prediabetes and vitamin D.   His blood pressure has been controlled at home, today their BP is BP: 122/70 mmHg.   He does workout.  He is doing strength training and running 4-5 days weekly.  He denies chest pain, shortness of breath, dizziness.   He is not on cholesterol medication and denies myalgias. His cholesterol is not at goal. The cholesterol last visit was:   Lab Results  Component Value Date   CHOL 226* 10/27/2013   HDL 50 10/27/2013   LDLCALC 139* 10/27/2013   TRIG 187* 10/27/2013   CHOLHDL 4.5 10/27/2013  He does not want to be on medications. He would prefer to be controlled with diet and exercise.   He has been working on diet and exercise for prediabetes, and denies foot ulcerations, nausea, paresthesia of the feet, polydipsia, polyuria, visual disturbances, vomiting and weight loss. Last A1C in the office was:  Lab Results  Component Value Date   HGBA1C 5.5 10/27/2013    Patient is on Vitamin D supplement.  Lab Results  Component Value Date   VD25OH 74 10/27/2013      Current Medications:  Current Outpatient Prescriptions on File Prior to Visit  Medication Sig Dispense Refill  . aspirin 81 MG EC tablet Take 81 mg by mouth daily.      . B Complex Vitamins (VITAMIN B COMPLEX PO) Take by  mouth daily.    Marland Kitchen esomeprazole (NEXIUM) 40 MG capsule Take 1 capsule (40 mg total) by mouth daily as needed. 30 capsule 11  . ferrous fumarate (HEMOCYTE - 106 MG FE) 325 (106 FE) MG TABS tablet Take 1 tablet by mouth.    Marland Kitchen glucose blood test strip Use as instructed (One Touch Ultra Teststrips) - check glucose tid 100 each 12  . Multiple Vitamin (MULTIVITAMIN) tablet Take 1 tablet by mouth daily.     No current facility-administered medications on file prior to visit.    Medical History:  Past Medical History  Diagnosis Date  . Hyperlipidemia   . Low testosterone   . Sciatica   . GERD (gastroesophageal reflux disease)   . OSA (obstructive sleep apnea)   . DM (diabetes mellitus), gestational, diet controlled   . History of ETT     cardiolite > 10 years ago was normal per patient's report    Allergies: No Known Allergies   Review of Systems:  Review of Systems  Constitutional: Negative for fever, chills and diaphoresis.  HENT: Negative for congestion, ear pain, hearing loss, nosebleeds, sore throat and tinnitus.   Eyes: Negative.   Respiratory: Negative for cough, sputum production, shortness of breath and wheezing.   Cardiovascular: Negative for chest pain, palpitations, claudication and leg swelling.  Gastrointestinal: Positive for heartburn. Negative for nausea, vomiting, diarrhea, constipation, blood in stool and melena.  Genitourinary: Negative.  Musculoskeletal: Negative.   Skin: Negative.   Neurological: Negative.  Negative for weakness and headaches.  Psychiatric/Behavioral: Negative.     Family history- Review and unchanged  Social history- Review and unchanged  Physical Exam: BP 122/70 mmHg  Pulse 88  Temp(Src) 98.2 F (36.8 C) (Temporal)  Resp 16  Ht 5\' 10"  (1.778 m)  Wt 209 lb (94.802 kg)  BMI 29.99 kg/m2 Wt Readings from Last 3 Encounters:  05/20/14 209 lb (94.802 kg)  10/27/13 198 lb 3.2 oz (89.903 kg)  06/03/13 238 lb 9.6 oz (108.228 kg)     General Appearance: Well nourished well developed, in no apparent distress. Eyes: PERRLA, EOMs, conjunctiva no swelling or erythema ENT/Mouth: Ear canals normal without obstruction, swelling, erythma, discharge.  TMs normal bilaterally.  Oropharynx moist, clear, without exudate, or postoropharyngeal swelling. Neck: Supple, thyroid normal,no cervical adenopathy  Respiratory: Respiratory effort normal, Breath sounds clear A&P without rhonchi, wheeze, or rale.  No retractions, no accessory usage. Cardio: RRR with no MRGs. Brisk peripheral pulses without edema.  Abdomen: Soft, + BS,  Non tender, no guarding, rebound, hernias, masses. Musculoskeletal: Full ROM, 5/5 strength, Normal gait Skin: Warm, dry without rashes, lesions, ecchymosis.  Neuro: Awake and oriented X 3, Cranial nerves intact. Normal muscle tone, no cerebellar symptoms. Psych: Normal affect, Insight and Judgment appropriate.    FORCUCCI, Leeum Sankey, PA-C 10:58 AM [facility]

## 2014-05-20 NOTE — Patient Instructions (Signed)

## 2014-05-21 LAB — HEPATIC FUNCTION PANEL
ALBUMIN: 4.3 g/dL (ref 3.5–5.2)
ALK PHOS: 111 U/L (ref 39–117)
ALT: 16 U/L (ref 0–53)
AST: 19 U/L (ref 0–37)
Bilirubin, Direct: 0.1 mg/dL (ref 0.0–0.3)
Indirect Bilirubin: 0.9 mg/dL (ref 0.2–1.2)
Total Bilirubin: 1 mg/dL (ref 0.2–1.2)
Total Protein: 7 g/dL (ref 6.0–8.3)

## 2014-05-21 LAB — BASIC METABOLIC PANEL WITH GFR
BUN: 11 mg/dL (ref 6–23)
CO2: 29 mEq/L (ref 19–32)
CREATININE: 0.93 mg/dL (ref 0.50–1.35)
Calcium: 9 mg/dL (ref 8.4–10.5)
Chloride: 103 mEq/L (ref 96–112)
GFR, Est Non African American: >89 mL/min
GLUCOSE: 97 mg/dL (ref 70–99)
Potassium: 4.3 mEq/L (ref 3.5–5.3)
Sodium: 141 mEq/L (ref 135–145)

## 2014-05-21 LAB — LIPID PANEL
Cholesterol: 229 mg/dL — ABNORMAL HIGH (ref 0–200)
HDL: 42 mg/dL (ref >=40)
LDL Cholesterol: 148 mg/dL — ABNORMAL HIGH (ref 0–99)
Total CHOL/HDL Ratio: 5.5 Ratio
Triglycerides: 195 mg/dL — ABNORMAL HIGH (ref <150)
VLDL: 39 mg/dL (ref 0–40)

## 2014-05-21 LAB — TSH: TSH: 1.414 u[IU]/mL (ref 0.350–4.500)

## 2014-05-21 LAB — HEMOGLOBIN A1C
HEMOGLOBIN A1C: 5.5 % (ref <5.7)
Mean Plasma Glucose: 111 mg/dL (ref <117)

## 2014-05-21 LAB — VITAMIN D 25 HYDROXY (VIT D DEFICIENCY, FRACTURES): Vit D, 25-Hydroxy: 39 ng/mL (ref 30–100)

## 2014-05-21 LAB — INSULIN, FASTING: Insulin fasting, serum: 8.8 u[IU]/mL (ref 2.0–19.6)

## 2014-05-21 LAB — MAGNESIUM: Magnesium: 2.3 mg/dL (ref 1.5–2.5)

## 2014-11-01 ENCOUNTER — Encounter: Payer: Self-pay | Admitting: Internal Medicine

## 2014-11-03 ENCOUNTER — Encounter: Payer: Self-pay | Admitting: Internal Medicine

## 2014-12-10 ENCOUNTER — Encounter: Payer: Self-pay | Admitting: Internal Medicine

## 2014-12-10 ENCOUNTER — Ambulatory Visit (INDEPENDENT_AMBULATORY_CARE_PROVIDER_SITE_OTHER): Payer: 59 | Admitting: Internal Medicine

## 2014-12-10 VITALS — BP 118/84 | HR 72 | Temp 97.7°F | Resp 16 | Ht 70.5 in | Wt 210.6 lb

## 2014-12-10 DIAGNOSIS — Z9989 Dependence on other enabling machines and devices: Secondary | ICD-10-CM

## 2014-12-10 DIAGNOSIS — E669 Obesity, unspecified: Secondary | ICD-10-CM

## 2014-12-10 DIAGNOSIS — Z0001 Encounter for general adult medical examination with abnormal findings: Secondary | ICD-10-CM

## 2014-12-10 DIAGNOSIS — Z125 Encounter for screening for malignant neoplasm of prostate: Secondary | ICD-10-CM

## 2014-12-10 DIAGNOSIS — R5383 Other fatigue: Secondary | ICD-10-CM

## 2014-12-10 DIAGNOSIS — Z79899 Other long term (current) drug therapy: Secondary | ICD-10-CM

## 2014-12-10 DIAGNOSIS — I10 Essential (primary) hypertension: Secondary | ICD-10-CM

## 2014-12-10 DIAGNOSIS — Z111 Encounter for screening for respiratory tuberculosis: Secondary | ICD-10-CM | POA: Diagnosis not present

## 2014-12-10 DIAGNOSIS — R7309 Other abnormal glucose: Secondary | ICD-10-CM

## 2014-12-10 DIAGNOSIS — Z1212 Encounter for screening for malignant neoplasm of rectum: Secondary | ICD-10-CM

## 2014-12-10 DIAGNOSIS — Z0000 Encounter for general adult medical examination without abnormal findings: Secondary | ICD-10-CM | POA: Diagnosis not present

## 2014-12-10 DIAGNOSIS — G4733 Obstructive sleep apnea (adult) (pediatric): Secondary | ICD-10-CM

## 2014-12-10 DIAGNOSIS — E782 Mixed hyperlipidemia: Secondary | ICD-10-CM

## 2014-12-10 DIAGNOSIS — E559 Vitamin D deficiency, unspecified: Secondary | ICD-10-CM

## 2014-12-10 DIAGNOSIS — E349 Endocrine disorder, unspecified: Secondary | ICD-10-CM

## 2014-12-10 DIAGNOSIS — K2100 Gastro-esophageal reflux disease with esophagitis, without bleeding: Secondary | ICD-10-CM

## 2014-12-10 DIAGNOSIS — Z6836 Body mass index (BMI) 36.0-36.9, adult: Secondary | ICD-10-CM | POA: Insufficient documentation

## 2014-12-10 DIAGNOSIS — R7303 Prediabetes: Secondary | ICD-10-CM

## 2014-12-10 LAB — BASIC METABOLIC PANEL WITH GFR
BUN: 19 mg/dL (ref 7–25)
CALCIUM: 9.2 mg/dL (ref 8.6–10.3)
CO2: 29 mmol/L (ref 20–31)
CREATININE: 1.02 mg/dL (ref 0.70–1.33)
Chloride: 103 mmol/L (ref 98–110)
GFR, EST NON AFRICAN AMERICAN: 82 mL/min (ref >=60)
GFR, Est African American: >89 mL/min (ref >=60)
Glucose, Bld: 98 mg/dL (ref 65–99)
Potassium: 4.1 mmol/L (ref 3.5–5.3)
Sodium: 139 mmol/L (ref 135–146)

## 2014-12-10 LAB — LIPID PANEL
CHOL/HDL RATIO: 6.3 ratio — AB (ref <=5.0)
CHOLESTEROL: 246 mg/dL — AB (ref 125–200)
HDL: 39 mg/dL — AB (ref >=40)
LDL Cholesterol: 167 mg/dL — ABNORMAL HIGH (ref <130)
TRIGLYCERIDES: 199 mg/dL — AB (ref <150)
VLDL: 40 mg/dL — ABNORMAL HIGH (ref <30)

## 2014-12-10 LAB — HEPATIC FUNCTION PANEL
ALT: 17 U/L (ref 9–46)
AST: 18 U/L (ref 10–35)
Albumin: 4.3 g/dL (ref 3.6–5.1)
Alkaline Phosphatase: 117 U/L — ABNORMAL HIGH (ref 40–115)
BILIRUBIN TOTAL: 1 mg/dL (ref 0.2–1.2)
Bilirubin, Direct: 0.2 mg/dL (ref <=0.2)
Indirect Bilirubin: 0.8 mg/dL (ref 0.2–1.2)
Total Protein: 6.8 g/dL (ref 6.1–8.1)

## 2014-12-10 LAB — CBC WITH DIFFERENTIAL/PLATELET
BASOS PCT: 0 % (ref 0–1)
Basophils Absolute: 0 10*3/uL (ref 0.0–0.1)
EOS ABS: 0.2 10*3/uL (ref 0.0–0.7)
Eosinophils Relative: 3 % (ref 0–5)
HCT: 42.8 % (ref 39.0–52.0)
Hemoglobin: 14.8 g/dL (ref 13.0–17.0)
Lymphocytes Relative: 39 % (ref 12–46)
Lymphs Abs: 2.9 10*3/uL (ref 0.7–4.0)
MCH: 31.3 pg (ref 26.0–34.0)
MCHC: 34.6 g/dL (ref 30.0–36.0)
MCV: 90.5 fL (ref 78.0–100.0)
MONO ABS: 0.8 10*3/uL (ref 0.1–1.0)
MONOS PCT: 10 % (ref 3–12)
MPV: 11.6 fL (ref 8.6–12.4)
NEUTROS PCT: 48 % (ref 43–77)
Neutro Abs: 3.6 10*3/uL (ref 1.7–7.7)
PLATELETS: 163 10*3/uL (ref 150–400)
RBC: 4.73 MIL/uL (ref 4.22–5.81)
RDW: 14.2 % (ref 11.5–15.5)
WBC: 7.5 10*3/uL (ref 4.0–10.5)

## 2014-12-10 LAB — IRON AND TIBC
%SAT: 37 % (ref 15–60)
Iron: 103 ug/dL (ref 50–180)
TIBC: 275 ug/dL (ref 250–425)
UIBC: 172 ug/dL (ref 125–400)

## 2014-12-10 LAB — MAGNESIUM: MAGNESIUM: 2.4 mg/dL (ref 1.5–2.5)

## 2014-12-10 NOTE — Patient Instructions (Addendum)
GETTING OFF OF PPI's    Nexium/protonix/prilosec/Omeprazole/Dexilant/Aciphex are called PPI's, they are great at healing your stomach but should only be taken for a short period of time.     Recent studies have shown that taken for a long time they  can increase the risk of osteoporosis (weakening of your bones), pneumonia, low magnesium, restless legs, Cdiff (infection that causes diarrhea), DEMENTIA and most recently kidney damage / disease / insufficiency.     Due to this information we want to try to stop the PPI but if you try to stop it abruptly this can cause rebound acid and worsening symptoms.   So this is how we want you to get off the PPI:  - Start taking the nexium/protonix/prilosec/PPI  every other day with  zantac (ranitidine) 2 x a day for 2-4 weeks  - then decrease the PPI to every 3 days while taking the zantac (ranitidine) twice a day the other  days for 2-4  Weeks  - then you can try the zantac (ranitidine) once at night or up to 2 x day as needed.  - you can continue on this once at night or stop all together  - Avoid alcohol, spicy foods, NSAIDS (aleve, ibuprofen) at this time. See foods below.   +++++++++++++++++++++++++++++++++++++++++++  Food Choices for Gastroesophageal Reflux Disease  When you have gastroesophageal reflux disease (GERD), the foods you eat and your eating habits are very important. Choosing the right foods can help ease the discomfort of GERD. WHAT GENERAL GUIDELINES DO I NEED TO FOLLOW?  Choose fruits, vegetables, whole grains, low-fat dairy products, and low-fat meat, fish, and poultry.  Limit fats such as oils, salad dressings, butter, nuts, and avocado.  Keep a food diary to identify foods that cause symptoms.  Avoid foods that cause reflux. These may be different for different people.  Eat frequent small meals instead of three large meals each day.  Eat your meals slowly, in a relaxed setting.  Limit fried foods.  Cook foods  using methods other than frying.  Avoid drinking alcohol.  Avoid drinking large amounts of liquids with your meals.  Avoid bending over or lying down until 2-3 hours after eating.   WHAT FOODS ARE NOT RECOMMENDED? The following are some foods and drinks that may worsen your symptoms:  Vegetables Tomatoes. Tomato juice. Tomato and spaghetti sauce. Chili peppers. Onion and garlic. Horseradish. Fruits Oranges, grapefruit, and lemon (fruit and juice). Meats High-fat meats, fish, and poultry. This includes hot dogs, ribs, ham, sausage, salami, and bacon. Dairy Whole milk and chocolate milk. Sour cream. Cream. Butter. Ice cream. Cream cheese.  Beverages Coffee and tea, with or without caffeine. Carbonated beverages or energy drinks. Condiments Hot sauce. Barbecue sauce.  Sweets/Desserts Chocolate and cocoa. Donuts. Peppermint and spearmint. Fats and Oils High-fat foods, including [place] fries and potato chips. Other Vinegar. Strong spices, such as black pepper, white pepper, red pepper, cayenne, curry powder, cloves, ginger, and chili powder. Nexium/protonix/prilosec are called PPI's, they are great at healing your stomach but should only be taken for a short period of time.    +++++++++++++++++++++++++++++++ Recommend Adult Low dose Aspirin or   coated  Aspirin 81 mg daily   To reduce risk of Colon Cancer 20 %,   Skin Cancer 26 % ,   Melanoma 46%   and   Pancreatic cancer 60%  ++++++++++++++++++  Vitamin D goal   is between 70-100.   Please make sure that you are taking your Vitamin D as  directed.   It is very important as a natural anti-inflammatory   helping hair, skin, and nails, as well as reducing stroke and heart attack risk.   It helps your bones and helps with mood.  It also decreases numerous cancer risks so please take it as directed.   Low Vit D is associated with a 200-300% higher risk for CANCER   and 200-300% higher risk for HEART   ATTACK  &   STROKE.   .....................................Marland Kitchen  It is also associated with higher death rate at younger ages,   autoimmune diseases like Rheumatoid arthritis, Lupus, Multiple Sclerosis.     Also many other serious conditions, like depression, Alzheimer's  Dementia, infertility, muscle aches, fatigue, fibromyalgia - just to name a few.  +++++++++++++++++++  Recommend the book "The END of DIETING" by Dr Monico Hoar   & the book "The END of DIABETES " by Dr Monico Hoar  At [email or web address] - get book & Audio CD's     Being diabetic has a  300% increased risk for heart attack, stroke, cancer, and alzheimer- type vascular dementia. It is very important that you work harder with diet by avoiding all foods that are white. Avoid white rice (brown & wild rice is OK), white potatoes (sweetpotatoes in moderation is OK), White bread or wheat bread or anything made out of white flour like bagels, donuts, rolls, buns, biscuits, cakes, pastries, cookies, pizza crust, and pasta (made from white flour & egg whites) - vegetarian pasta or spinach or wheat pasta is OK. Multigrain breads like Arnold's or [employer], or multigrain sandwich thins or flatbreads.  Diet, exercise and weight loss can reverse and cure diabetes in the early stages.  Diet, exercise and weight loss is very important in the control and prevention of complications of diabetes which affects every system in your body, ie. Brain - dementia/stroke, eyes - glaucoma/blindness, heart - heart attack/heart failure, kidneys - dialysis, stomach - gastric paralysis, intestines - malabsorption, nerves - severe painful neuritis, circulation - gangrene & loss of a leg(s), and finally cancer and Alzheimers.    I recommend avoid fried & greasy foods,  sweets/candy, white rice (brown or wild rice or Quinoa is OK), white potatoes (sweet potatoes are OK) - anything made from white flour - bagels, doughnuts, rolls, buns, biscuits,white and wheat breads, pizza  crust and traditional pasta made of white flour & egg white(vegetarian pasta or spinach or wheat pasta is OK).  Multi-grain bread is OK - like multi-grain flat bread or sandwich thins. Avoid alcohol in excess. Exercise is also important.    Eat all the vegetables you want - avoid meat, especially red meat and dairy - especially cheese.  Cheese is the most concentrated form of trans-fats which is the worst thing to clog up our arteries. Veggie cheese is OK which can be found in the fresh produce section at [facility] or [employer] or [employer]  ++++++++++++++++++++++++++   Preventive Care for Adults  A healthy lifestyle and preventive care can promote health and wellness. Preventive health guidelines for men include the following key practices:  A routine yearly physical is a good way to check with your health care provider about your health and preventative screening. It is a chance to share any concerns and updates on your health and to receive a thorough exam.  Visit your dentist for a routine exam and preventative care every 6 months. Brush your teeth twice a day and floss once a day. Good oral hygiene  prevents tooth decay and gum disease.  The frequency of eye exams is based on your age, health, family medical history, use of contact lenses, and other factors. Follow your health care provider's recommendations for frequency of eye exams.  Eat a healthy diet. Foods such as vegetables, fruits, whole grains, low-fat dairy products, and lean protein foods contain the nutrients you need without too many calories. Decrease your intake of foods high in solid fats, added sugars, and salt. Eat the right amount of calories for you.Get information about a proper diet from your health care provider, if necessary.  Regular physical exercise is one of the most important things you can do for your health. Most adults should get at least 150 minutes of moderate-intensity exercise (any activity that  increases your heart rate and causes you to sweat) each week. In addition, most adults need muscle-strengthening exercises on 2 or more days a week.  Maintain a healthy weight. The body mass index (BMI) is a screening tool to identify possible weight problems. It provides an estimate of body fat based on height and weight. Your health care provider can find your BMI and can help you achieve or maintain a healthy weight.For adults 20 years and older:  A BMI below 18.5 is considered underweight.  A BMI of 18.5 to 24.9 is normal.  A BMI of 25 to 29.9 is considered overweight.  A BMI of 30 and above is considered obese.  Maintain normal blood lipids and cholesterol levels by exercising and minimizing your intake of saturated fat. Eat a balanced diet with plenty of fruit and vegetables. Blood tests for lipids and cholesterol should begin at age 14 and be repeated every 5 years. If your lipid or cholesterol levels are high, you are over 50, or you are at high risk for heart disease, you may need your cholesterol levels checked more frequently.Ongoing high lipid and cholesterol levels should be treated with medicines if diet and exercise are not working.  If you smoke, find out from your health care provider how to quit. If you do not use tobacco, do not start.  Lung cancer screening is recommended for adults aged 55-80 years who are at high risk for developing lung cancer because of a history of smoking. A yearly low-dose CT scan of the lungs is recommended for people who have at least a 30-pack-year history of smoking and are a current smoker or have quit within the past 15 years. A pack year of smoking is smoking an average of 1 pack of cigarettes a day for 1 year (for example: 1 pack a day for 30 years or 2 packs a day for 15 years). Yearly screening should continue until the smoker has stopped smoking for at least 15 years. Yearly screening should be stopped for people who develop a health problem  that would prevent them from having lung cancer treatment.  If you choose to drink alcohol, do not have more than 2 drinks per day. One drink is considered to be 12 ounces (355 mL) of beer, 5 ounces (148 mL) of wine, or 1.5 ounces (44 mL) of liquor.  Avoid use of street drugs. Do not share needles with anyone. Ask for help if you need support or instructions about stopping the use of drugs.  High blood pressure causes heart disease and increases the risk of stroke. Your blood pressure should be checked at least every 1-2 years. Ongoing high blood pressure should be treated with medicines, if weight loss and  exercise are not effective.  If you are 71-20 years old, ask your health care provider if you should take aspirin to prevent heart disease.  Diabetes screening involves taking a blood sample to check your fasting blood sugar level. This should be done once every 3 years, after age 85, if you are within normal weight and without risk factors for diabetes. Testing should be considered at a younger age or be carried out more frequently if you are overweight and have at least 1 risk factor for diabetes.  Colorectal cancer can be detected and often prevented. Most routine colorectal cancer screening begins at the age of 61 and continues through age 4. However, your health care provider may recommend screening at an earlier age if you have risk factors for colon cancer. On a yearly basis, your health care provider may provide home test kits to check for hidden blood in the stool. Use of a small camera at the end of a tube to directly examine the colon (sigmoidoscopy or colonoscopy) can detect the earliest forms of colorectal cancer. Talk to your health care provider about this at age 37, when routine screening begins. Direct exam of the colon should be repeated every 5-10 years through age 28, unless early forms of precancerous polyps or small growths are found.   Talk with your health care provider  about prostate cancer screening.  Testicular cancer screening isrecommended for adult males. Screening includes self-exam, a health care provider exam, and other screening tests. Consult with your health care provider about any symptoms you have or any concerns you have about testicular cancer.  Use sunscreen. Apply sunscreen liberally and repeatedly throughout the day. You should seek shade when your shadow is shorter than you. Protect yourself by wearing long sleeves, pants, a wide-brimmed hat, and sunglasses year round, whenever you are outdoors.  Once a month, do a whole-body skin exam, using a mirror to look at the skin on your back. Tell your health care provider about new moles, moles that have irregular borders, moles that are larger than a pencil eraser, or moles that have changed in shape or color.  Stay current with required vaccines (immunizations).  Influenza vaccine. All adults should be immunized every year.  Tetanus, diphtheria, and acellular pertussis (Td, Tdap) vaccine. An adult who has not previously received Tdap or who does not know his vaccine status should receive 1 dose of Tdap. This initial dose should be followed by tetanus and diphtheria toxoids (Td) booster doses every 10 years. Adults with an unknown or incomplete history of completing a 3-dose immunization series with Td-containing vaccines should begin or complete a primary immunization series including a Tdap dose. Adults should receive a Td booster every 10 years.  Varicella vaccine. An adult without evidence of immunity to varicella should receive 2 doses or a second dose if he has previously received 1 dose.  Human papillomavirus (HPV) vaccine. Males aged 13-21 years who have not received the vaccine previously should receive the 3-dose series. Males aged 22-26 years may be immunized. Immunization is recommended through the age of 2 years for any male who has sex with males and did not get any or all doses earlier.  Immunization is recommended for any person with an immunocompromised condition through the age of 26 years if he did not get any or all doses earlier. During the 3-dose series, the second dose should be obtained 4-8 weeks after the first dose. The third dose should be obtained 24 weeks after the  first dose and 16 weeks after the second dose.  Zoster vaccine. One dose is recommended for adults aged 70 years or older unless certain conditions are present.    PREVNAR  - Pneumococcal 13-valent conjugate (PCV13) vaccine. When indicated, a person who is uncertain of his immunization history and has no record of immunization should receive the PCV13 vaccine. An adult aged 26 years or older who has certain medical conditions and has not been previously immunized should receive 1 dose of PCV13 vaccine. This PCV13 should be followed with a dose of pneumococcal polysaccharide (PPSV23) vaccine. The PPSV23 vaccine dose should be obtained at least 8 weeks after the dose of PCV13 vaccine. An adult aged 63 years or older who has certain medical conditions and previously received 1 or more doses of PPSV23 vaccine should receive 1 dose of PCV13. The PCV13 vaccine dose should be obtained 1 or more years after the last PPSV23 vaccine dose.    PNEUMOVAX - Pneumococcal polysaccharide (PPSV23) vaccine. When PCV13 is also indicated, PCV13 should be obtained first. All adults aged 21 years and older should be immunized. An adult younger than age 11 years who has certain medical conditions should be immunized. Any person who resides in a nursing home or long-term care facility should be immunized. An adult smoker should be immunized. People with an immunocompromised condition and certain other conditions should receive both PCV13 and PPSV23 vaccines. People with human immunodeficiency virus (HIV) infection should be immunized as soon as possible after diagnosis. Immunization during chemotherapy or radiation therapy should be  avoided. Routine use of PPSV23 vaccine is not recommended for American Indians, [address], or people younger than 65 years unless there are medical conditions that require PPSV23 vaccine. When indicated, people who have unknown immunization and have no record of immunization should receive PPSV23 vaccine. One-time revaccination 5 years after the first dose of PPSV23 is recommended for people aged 19-64 years who have chronic kidney failure, nephrotic syndrome, asplenia, or immunocompromised conditions. People who received 1-2 doses of PPSV23 before age 71 years should receive another dose of PPSV23 vaccine at age 80 years or later if at least 5 years have passed since the previous dose. Doses of PPSV23 are not needed for people immunized with PPSV23 at or after age 48 years.    Hepatitis A vaccine. Adults who wish to be protected from this disease, have certain high-risk conditions, work with hepatitis A-infected animals, work in hepatitis A research labs, or travel to or work in countries with a high rate of hepatitis A should be immunized. Adults who were previously unvaccinated and who anticipate close contact with an international adoptee during the first 60 days after arrival in the [place] from a country with a high rate of hepatitis A should be immunized.    Hepatitis B vaccine. Adults should be immunized if they wish to be protected from this disease, have certain high-risk conditions, may be exposed to blood or other infectious body fluids, are household contacts or sex partners of hepatitis B positive people, are clients or workers in certain care facilities, or travel to or work in countries with a high rate of hepatitis B.   Preventive Service / Frequency   Ages 75 to 55  Blood pressure check.  Lipid and cholesterol check  Lung cancer screening. / Every year if you are aged 55-80 years and have a 30-pack-year history of smoking and currently smoke or have quit within the  past 15 years. Yearly screening is  stopped once you have quit smoking for at least 15 years or develop a health problem that would prevent you from having lung cancer treatment.  Fecal occult blood test (FOBT) of stool. / Every year beginning at age 60 and continuing until age 80. You may not have to do this test if you get a colonoscopy every 10 years.  Flexible sigmoidoscopy** or colonoscopy.** / Every 5 years for a flexible sigmoidoscopy or every 10 years for a colonoscopy beginning at age 3 and continuing until age 44. Screening for abdominal aortic aneurysm (AAA)  by ultrasound is recommended for people who have history of high blood pressure or who are current or former smokers.

## 2014-12-10 NOTE — Progress Notes (Addendum)
Patient ID: Erik Salas, male   DOB: [birth year], 54 y.o.   MRN: [id number]   Wellness Visit & Comprehensive Evaluation,  Examination and Management  This very nice 54 y.o. MWM presents for. presents for a Wellness Visit & comprehensive evaluation and management of multiple medical co-morbidities.  Patient has been followed for labile HTN, Prediabetes, Hyperlipidemia and Vitamin D Deficiency. Patient has hx/o OSA and had been on CPAP which has been stopped since losing 35#. His wife reports no more snoring or apneic respirations.    Labile HTN predates since 2013 and has been monitored expectently. Patient's BP has been controlled and today's BP: 118/84 mmHg. In 2011 , he had a negative Cardiolite. Patient denies any cardiac symptoms as chest pain, palpitations, shortness of breath, dizziness or ankle swelling.   Patient's hyperlipidemia is not controlled with diet and omega 3 supplements. Patient denies myalgias or other medication SE's. Last lipids were not at goal with Cholesterol 229*; HDL 42; LDL 148*; and  Triglycerides 195 on 05/20/2014.   Patient has prediabetes since 2009 with A1c 5.7% and over the last 2 years he has lost ~ 35 # due to better eating habits.  He denies reactive hypoglycemic symptoms, visual blurring, diabetic polys or paresthesias. Last A1c was 5.5% on 05/20/2014.   Finally, patient has history of Vitamin D Deficiency and last vitamin D was 39 on 05/20/2014.      Medication Sig  . aspirin 81 MG EC  Take 81 mg by mouth daily.    . B Complex Vitamins  Take by mouth daily.  . Calcium Carbonate-Vit D-Min (1200 mg) Take by mouth daily.  Marland Kitchen VITAMIN D  Take 5,000 Units by mouth daily.  Marland Kitchen esomeprazole  40 MG  Take 1 capsule (40 mg total) by mouth daily as needed.  . ferrous fumarate (HEMOCYTE - 106 MG FE) 325 (106 FE) MG  Take 1 tablet by mouth.  Marland Kitchen glucosamine-chondroitin 500-400 MG  Take 1 tablet by mouth 2 (two) times daily.  Marland Kitchen KRILL OIL PO Take by mouth daily.  . Multiple  Vitamin  Take 1 tablet by mouth daily.  . OCUVITE-LUTEIN Take 1 capsule by mouth daily.  . Omega-3  Take by mouth daily.  . Herbal Life Prostrate Health    No Known Allergies   Past Medical History  Diagnosis Date  . Hyperlipidemia   . Low testosterone   . Sciatica   . GERD (gastroesophageal reflux disease)   . OSA (obstructive sleep apnea)   . DM (diabetes mellitus), gestational, diet controlled   . History of ETT     cardiolite > 10 years ago was normal per patient's report   Health Maintenance  Topic Date Due  . PNEUMOCOCCAL POLYSACCHARIDE VACCINE (1) 11/08/1960  . OPHTHALMOLOGY EXAM  11/08/1968  . INFLUENZA VACCINE  10/11/2014  . FOOT EXAM  10/28/2014  . HEMOGLOBIN A1C  06/09/2015  . URINE MICROALBUMIN  12/10/2015  . TETANUS/TDAP  02/09/2018  . COLONOSCOPY  06/28/2019  . Hepatitis C Screening  Completed  . HIV Screening  Completed   Immunization History  Administered Date(s) Administered  . PPD Test 10/27/2013, 12/10/2014  . Tdap 02/10/2008   Past Surgical History  Procedure Laterality Date  . Appendectomy     Family History  Problem Relation Age of Onset  . Heart attack Father 71  . Heart attack Brother 57  . Heart attack Sister 39   Social History   Social History  . Marital Status: Married  Spouse Name: [name]  . Number of Children: [name]  . Years of Education: [name]   Occupational History  . Parts Manager     [employer]   Social History Main Topics  . Smoking status: Former Smoker    Quit date: 03/12/1986  . Smokeless tobacco: Not on file  . Alcohol Use: 0.0 oz/week    0 Standard drinks or equivalent per week     Comment: very rarely  . Drug Use: Not on file  . Sexual Activity: Not on file   Other Topics Concern  . Not on file   Social History Narrative   Lives in [place]    ROS Constitutional: Denies fever, chills, weight loss/gain, headaches, insomnia,  night sweats or change in appetite. Does c/o fatigue. Eyes: Denies redness, blurred  vision, diplopia, discharge, itchy or watery eyes.  ENT: Denies discharge, congestion, post nasal drip, epistaxis, sore throat, earache, hearing loss, dental pain, Tinnitus, Vertigo, Sinus pain or snoring.  Cardio: Denies chest pain, palpitations, irregular heartbeat, syncope, dyspnea, diaphoresis, orthopnea, PND, claudication or edema Respiratory: denies cough, dyspnea, DOE, pleurisy, hoarseness, laryngitis or wheezing.  Gastrointestinal: Denies dysphagia, heartburn, reflux, water brash, pain, cramps, nausea, vomiting, bloating, diarrhea, constipation, hematemesis, melena, hematochezia, jaundice or hemorrhoids Genitourinary: Denies dysuria, frequency, urgency, nocturia, hesitancy, discharge, hematuria or flank pain Musculoskeletal: Denies arthralgia, myalgia, stiffness, Jt. Swelling, pain, limp or strain/sprain. Denies Falls. Skin: Denies puritis, rash, hives, warts, acne, eczema or change in skin lesion Neuro: No weakness, tremor, incoordination, spasms, paresthesia or pain Psychiatric: Denies confusion, memory loss or sensory loss. Denies Depression. Endocrine: Denies change in weight, skin, hair change, nocturia, and paresthesia, diabetic polys, visual blurring or hyper / hypo glycemic episodes.  Heme/Lymph: No excessive bleeding, bruising or enlarged lymph nodes.  Physical Exam  BP 118/84 mmHg  Pulse 72  Temp(Src) 97.7 F (36.5 C)  Resp 16  Ht 5' 10.5" (1.791 m)  Wt 210 lb 9.6 oz (95.528 kg)  BMI 29.78 kg/m2  General Appearance: Well nourished &  in no apparent distress. Eyes: PERRLA, EOMs, conjunctiva no swelling or erythema, normal fundi and vessels. Sinuses: No frontal/maxillary tenderness ENT/Mouth: EACs patent / TMs  nl. Nares clear without erythema, swelling, mucoid exudates. Oral hygiene is good. No erythema, swelling, or exudate. Tongue normal, non-obstructing. Tonsils not swollen or erythematous. Hearing normal.  Neck: Supple, thyroid normal. No bruits, nodes or  JVD. Respiratory: Respiratory effort normal.  BS equal and clear bilateral without rales, rhonci, wheezing or stridor. Cardio: Heart sounds are normal with regular rate and rhythm and no murmurs, rubs or gallops. Peripheral pulses are normal and equal bilaterally without edema. No aortic or femoral bruits. Chest: symmetric with normal excursions and percussion.  Abdomen: Flat, soft, with bowel sounds. Nontender, no guarding, rebound, hernias, masses, or organomegaly.  Lymphatics: Non tender without lymphadenopathy.  Genitourinary: No hernias.Testes nl. DRE - prostate nl for age - smooth & firm w/o nodules. Musculoskeletal: Full ROM all peripheral extremities, joint stability, 5/5 strength, and normal gait. Skin: Warm and dry without rashes, lesions, cyanosis, clubbing or  ecchymosis.  Neuro: Cranial nerves intact, reflexes equal bilaterally. Normal muscle tone, no cerebellar symptoms. Sensation intact.  Pysch: Alert and oriented X 3 with normal affect, insight and judgment appropriate.   Assessment and Plan  1. Encounter for general adult medical examination with abnormal findings   2. Essential hypertension  - Microalbumin / creatinine urine ratio - EKG 12-Lead - [place], RETROPERITNL ABD,  LTD - TSH  3. Hyperlipidemia  -  Lipid panel  4. Prediabetes  - Hemoglobin A1c - Insulin, random  5. Vitamin D deficiency  - Vit D  25 hydroxy   6. Testosterone deficiency  - Testosterone  7. GERD   8. OSA, resolved with weight loss   9. Obesity (BMI 36)   10. BMI 36.0-36.9,adult   11. Medication management  - Urinalysis, Routine w reflex microscopic  - CBC with Differential/Platelet - BASIC METABOLIC PANEL WITH GFR - Hepatic function panel - Magnesium  12. Screening for rectal cancer  - POC Hemoccult Bld/Stl   13. Prostate cancer screening  - PSA  14. Other fatigue  - Vitamin B12 - Testosterone - Iron and TIBC - TSH   Continue prudent diet as discussed,  weight control, BP monitoring, regular exercise, and medications as discussed.  Discussed med effects and SE's. Routine screening labs and tests as requested with regular follow-up as recommended.  Over 40 minutes of exam, counseling &  chart review was performed

## 2014-12-11 ENCOUNTER — Encounter: Payer: Self-pay | Admitting: Internal Medicine

## 2014-12-11 LAB — URINALYSIS, ROUTINE W REFLEX MICROSCOPIC
Bilirubin Urine: NEGATIVE
GLUCOSE, UA: NEGATIVE
HGB URINE DIPSTICK: NEGATIVE
Ketones, ur: NEGATIVE
LEUKOCYTES UA: NEGATIVE
Nitrite: NEGATIVE
PROTEIN: NEGATIVE
Specific Gravity, Urine: 1.02 (ref 1.001–1.035)
pH: 6 (ref 5.0–8.0)

## 2014-12-11 LAB — VITAMIN D 25 HYDROXY (VIT D DEFICIENCY, FRACTURES): VIT D 25 HYDROXY: 66 ng/mL (ref 30–100)

## 2014-12-11 LAB — TESTOSTERONE: TESTOSTERONE: 300 ng/dL (ref 300–890)

## 2014-12-11 LAB — VITAMIN B12: Vitamin B-12: 534 pg/mL (ref 211–911)

## 2014-12-11 LAB — INSULIN, RANDOM: Insulin: 6.9 u[IU]/mL (ref 2.0–19.6)

## 2014-12-11 LAB — MICROALBUMIN / CREATININE URINE RATIO
CREATININE, URINE: 154.4 mg/dL
Microalb, Ur: <0.2 mg/dL (ref <2.0)

## 2014-12-11 LAB — HEMOGLOBIN A1C
HEMOGLOBIN A1C: 5.4 % (ref <5.7)
Mean Plasma Glucose: 108 mg/dL (ref <117)

## 2014-12-11 LAB — TSH: TSH: 2.47 u[IU]/mL (ref 0.350–4.500)

## 2014-12-11 LAB — PSA: PSA: 0.57 ng/mL (ref <=4.00)

## 2014-12-18 ENCOUNTER — Encounter: Payer: Self-pay | Admitting: *Deleted

## 2015-01-28 ENCOUNTER — Telehealth: Payer: Self-pay | Admitting: *Deleted

## 2015-01-28 ENCOUNTER — Telehealth: Payer: Self-pay | Admitting: Internal Medicine

## 2015-01-28 MED ORDER — PREDNISONE 20 MG PO TABS: ORAL_TABLET | ORAL | Status: AC

## 2015-01-28 MED ORDER — BACLOFEN 10 MG PO TABS: 10.0000 mg | ORAL_TABLET | Freq: Two times a day (BID) | ORAL | Status: DC

## 2015-01-28 MED ORDER — PREDNISONE 20 MG PO TABS: ORAL_TABLET | ORAL | Status: DC

## 2015-01-28 NOTE — Telephone Encounter (Signed)
pulled muscle in glut and left leg  3 days ago, in severe pain, walking with cane. Has not taken OTC anti inflammatories, only a hot epson salt bath, please advise, ov, rx?  [place]

## 2015-01-28 NOTE — Telephone Encounter (Signed)
Patient called and states he pulled glut muscle 3 days ago.  He is having pain in his leg also.  Per Dr Oneta RackMcKeown, send in an RX for Prednisone and advised patient , if not better by Monday,call for an appointment.

## 2015-02-01 ENCOUNTER — Encounter: Payer: Self-pay | Admitting: Internal Medicine

## 2015-02-01 ENCOUNTER — Ambulatory Visit (INDEPENDENT_AMBULATORY_CARE_PROVIDER_SITE_OTHER): Payer: 59 | Admitting: Internal Medicine

## 2015-02-01 VITALS — BP 130/92 | HR 84 | Temp 97.7°F | Resp 16 | Ht 70.5 in | Wt 214.0 lb

## 2015-02-01 DIAGNOSIS — R7303 Prediabetes: Secondary | ICD-10-CM

## 2015-02-01 DIAGNOSIS — M5432 Sciatica, left side: Secondary | ICD-10-CM

## 2015-02-01 DIAGNOSIS — Z6836 Body mass index (BMI) 36.0-36.9, adult: Secondary | ICD-10-CM

## 2015-02-01 NOTE — Progress Notes (Addendum)
Subjective:    Patient ID: Erik Salas, male    DOB: April 13, 1960, 54 y.o.   MRN: [id number]  HPI  5 days ago patient developed new onset of L buttock pain & was called in Prednisone taper and Baclofen. Then 2 days ago his sx's worse and he went to an UC and received a trigger point injection in the L buttock and reports 90% improved today and would like to return to work w/o restrictions. No known hx/o LBP, DDD or sciatica.   Medication Sig  . aspirin 81 MG EC tablet Take 81 mg by mouth daily.    . B Complex Vitamins (VITAMIN B COMPLEX PO) Take by mouth daily.  . baclofen (LIORESAL) 10 MG tablet Take 1 tablet (10 mg total) by mouth 2 (two) times daily. Take 1/2 to 1 tablet 2 x day if needed for muscle spasm  . Calcium Carbonate-Vit D-Min (CALCIUM 1200 PO) Take by mouth daily.  . Cholecalciferol (VITAMIN D PO) Take 5,000 Units by mouth daily.  Marland Kitchen esomeprazole (NEXIUM) 40 MG capsule Take 1 capsule (40 mg total) by mouth daily as needed.  . ferrous fumarate (HEMOCYTE - 106 MG FE) 325 (106 FE) MG TABS tablet Take 1 tablet by mouth.  Marland Kitchen glucosamine-chondroitin 500-400 MG tablet Take 1 tablet by mouth 2 (two) times daily.  Marland Kitchen glucose blood test strip Use as instructed (One Touch Ultra Teststrips) - check glucose tid  . KRILL OIL PO Take by mouth daily.  . Multiple Vitamin (MULTIVITAMIN) tablet Take 1 tablet by mouth daily.  . multivitamin-lutein (OCUVITE-LUTEIN) CAPS capsule Take 1 capsule by mouth daily.  . Omega-3 Fatty Acids (OMEGA 3 PO) Take by mouth daily.  Marland Kitchen OVER THE COUNTER MEDICATION Herbal Life Prostrate Health  . predniSONE (DELTASONE) 20 MG tablet 1 pill 3 x a day for 3 days, 1 pill 2 x a day x 3 days, 1 pill a day x 5 days with food   No Known Allergies   Past Medical History  Diagnosis Date  . Hyperlipidemia   . Low testosterone   . Sciatica   . GERD (gastroesophageal reflux disease)   . OSA (obstructive sleep apnea)   . DM (diabetes mellitus), gestational, diet controlled   .  History of ETT     cardiolite > 10 years ago was normal per patient's report   Review of Systems  10 point systems review negative except as above.    Objective:   Physical Exam  BP 130/92 mmHg  Pulse 84  Temp(Src) 97.7 F (36.5 C)  Resp 16  Ht 5' 10.5" (1.791 m)  Wt 214 lb (97.07 kg)  BMI 30.26 kg/m2  HEENT - Eac's patent. TM's Nl. EOM's full. PERRLA. NasoOroPharynx clear. Neck - supple. Nl Thyroid. Carotids 2+ & No bruits, nodes, JVD Chest - Clear equal BS w/o Rales, rhonchi, wheezes. Cor - Nl HS. RRR w/o sig MGR. PP 1(+). No edema. Abd - No palpable organomegaly, masses or tenderness. BS nl. MS- FROM w/o deformities. Muscle power, tone and bulk Nl. Gait Nl. Neg SLR bilat. Neg left hip figure of 4. Nl gait.  Neuro - No obvious Cr N abnormalities. Sensory, motor and Cerebellar functions appear Nl w/o focal abnormalities. Psyche - Mental status normal & appropriate.  No delusions, ideations or obvious mood abnormalities.    Assessment & Plan:   1. Sciatica of left side - improving - discussed lifting body mechanics - recc finish prednisone - given note for employer to return w/o  job restrictions

## 2015-02-01 NOTE — Patient Instructions (Signed)
Erik Salas  You may return to your usual work activities  without sitting or lifting restrictions   Marinus Maw, MD      Sciatica - Resolved Sciatica is pain, weakness, numbness, or tingling along the path of the sciatic nerve. The nerve starts in the lower back and runs down the back of each leg. The nerve controls the muscles in the lower leg and in the back of the knee, while also providing sensation to the back of the thigh, lower leg, and the sole of your foot. Sciatica is a symptom of another medical condition. For instance, nerve damage or certain conditions, such as a herniated disk or bone spur on the spine, pinch or put pressure on the sciatic nerve. This causes the pain, weakness, or other sensations normally associated with sciatica. Generally, sciatica only affects one side of the body. CAUSES   Herniated or slipped disc.  Degenerative disk disease.  A pain disorder involving the narrow muscle in the buttocks (piriformis syndrome).  Pelvic injury or fracture.  Pregnancy.  Tumor (rare). SYMPTOMS  Symptoms can vary from mild to very severe. The symptoms usually travel from the low back to the buttocks and down the back of the leg. Symptoms can include:  Mild tingling or dull aches in the lower back, leg, or hip.  Numbness in the back of the calf or sole of the foot.  Burning sensations in the lower back, leg, or hip.  Sharp pains in the lower back, leg, or hip.  Leg weakness.  Severe back pain inhibiting movement. These symptoms may get worse with coughing, sneezing, laughing, or prolonged sitting or standing. Also, being overweight may worsen symptoms. DIAGNOSIS  Your caregiver will perform a physical exam to look for common symptoms of sciatica. He or she may ask you to do certain movements or activities that would trigger sciatic nerve pain. Other tests may be performed to find the cause of the sciatica. These may include:  Blood  tests.  X-rays.  Imaging tests, such as an MRI or CT scan. TREATMENT  Treatment is directed at the cause of the sciatic pain. Sometimes, treatment is not necessary and the pain and discomfort goes away on its own. If treatment is needed, your caregiver may suggest:  Over-the-counter medicines to relieve pain.  Prescription medicines, such as anti-inflammatory medicine, muscle relaxants, or narcotics.  Applying heat or ice to the painful area.  Steroid injections to lessen pain, irritation, and inflammation around the nerve.  Reducing activity during periods of pain.  Exercising and stretching to strengthen your abdomen and improve flexibility of your spine. Your caregiver may suggest losing weight if the extra weight makes the back pain worse.  Physical therapy.  Surgery to eliminate what is pressing or pinching the nerve, such as a bone spur or part of a herniated disk. HOME CARE INSTRUCTIONS   Only take over-the-counter or prescription medicines for pain or discomfort as directed by your caregiver.  Apply ice to the affected area for 20 minutes, 3-4 times a day for the first 48-72 hours. Then try heat in the same way.  Exercise, stretch, or perform your usual activities if these do not aggravate your pain.  Attend physical therapy sessions as directed by your caregiver.  Keep all follow-up appointments as directed by your caregiver.  Do not wear high heels or shoes that do not provide proper support.  Check your mattress to see if it is too soft. A firm mattress may lessen your pain  and discomfort. SEEK IMMEDIATE MEDICAL CARE IF:   You lose control of your bowel or bladder (incontinence).  You have increasing weakness in the lower back, pelvis, buttocks, or legs.  You have redness or swelling of your back.  You have a burning sensation when you urinate.  You have pain that gets worse when you lie down or awakens you at night.  Your pain is worse than you have  experienced in the past.  Your pain is lasting longer than 4 weeks.  You are suddenly losing weight without reason. MAKE SURE YOU:  Understand these instructions.  Will watch your condition.  Will get help right away if you are not doing well or get worse.   This information is not intended to replace advice given to you by your health care provider. Make sure you discuss any questions you have with your health care provider.   Document Released: 02/20/2001 Document Revised: 11/17/2014 Document Reviewed: 07/08/2011 [employer] Interactive Patient Education [employer]2016 [employer]

## 2015-03-15 ENCOUNTER — Ambulatory Visit: Payer: Self-pay | Admitting: Internal Medicine

## 2016-01-05 ENCOUNTER — Encounter: Payer: Self-pay | Admitting: Internal Medicine

## 2016-03-14 DIAGNOSIS — Z01419 Encounter for gynecological examination (general) (routine) without abnormal findings: Secondary | ICD-10-CM | POA: Diagnosis not present

## 2016-03-16 ENCOUNTER — Encounter: Payer: Self-pay | Admitting: Internal Medicine

## 2016-04-03 ENCOUNTER — Other Ambulatory Visit: Payer: Self-pay | Admitting: Internal Medicine

## 2016-04-09 ENCOUNTER — Encounter: Payer: Self-pay | Admitting: Internal Medicine

## 2016-05-07 ENCOUNTER — Encounter: Payer: Self-pay | Admitting: Internal Medicine

## 2016-05-07 NOTE — Progress Notes (Signed)
Erik Salas   Lucky Cowboy, M.D.    [name]. [name], P.A.-C      [name], P.A.-C  [facility]                [address]                [place], [place]. [address] Telephone [phone] Telefax [phone] Annual  Screening/Preventative Visit  & Comprehensive Evaluation & Examination     This very nice 56 y.o. MWM presents for a Screening/Preventative Visit & comprehensive evaluation and management of multiple medical co-morbidities.  Patient has been followed for labile  HTN, Prediabetes, Hyperlipidemia and Vitamin D Deficiency.      Patient has hx/o OSA on CPAP and after losing 35# was able to stop the CPAP w/o recurrence of snoring & apnea.  Patient also has GERD controlled with meds.     Patient has hx/o labile HTN predates since 2013 and is followed expectantly. Patient's BP has been controlled at home.  Today's BP is at goal - 118/82.  In 2011, he had a negative Cardiolite scan. Patient denies any cardiac symptoms as chest pain, palpitations, shortness of breath, dizziness or ankle swelling.     Patient's hyperlipidemia is not controlled with diet. Hr had been on Simvastatin for years w/o difficulties or Side-Effects and stopped after reading "somewhere" about possible side effects. Last lipids were not at goal: Lab Results  Component Value Date   CHOL 246 (H) 12/10/2014   HDL 39 (L) 12/10/2014   LDLCALC 167 (H) 12/10/2014   TRIG 199 (H) 12/10/2014   CHOLHDL 6.3 (H) 12/10/2014      Patient has prediabetes (A1c 5.7% in 2009)  and patient denies reactive hypoglycemic symptoms, visual blurring, diabetic polys or paresthesias. Last A1c was at goal after weight loss: Lab Results  Component Value Date   HGBA1C 5.4 12/10/2014       Finally, patient has history of Vitamin D Deficiency ("39" in 2016) and last vitamin D was at goal: Lab Results  Component Value Date   VD25OH 66 12/10/2014   Current  Outpatient Prescriptions on File Prior to Visit  Medication Sig  . aspirin 81 MG EC Take 81 mg by mouth daily.    Marland Kitchen VITAMIN B COMPLEX Take by mouth daily.  . Calcium 1200 -Vit D-Minerals  Take by mouth daily.  Marland Kitchen VITAMIN D Take 5,000 Units by mouth daily.  Marland Kitchen HEMOCYTE - 106 MG FE Take 1 tablet by mouth.  Marland Kitchen glucosamine-chondroitin 500-400 MG  Take 1 tab 2  times daily.  Marland Kitchen KRILL OIL PO Take  daily.  . Multiple Vitamin Take 1 tab daily.  Sandrea Hammond CAPS  Take 1 cap daily.  . Omega-3  Take daily.  . Herbal Life Prostrate Health    No Known Allergies   Past Medical History:  Diagnosis Date  . GERD (gastroesophageal reflux disease)   . History of ETT    cardiolite > 10 years ago was normal per patient's report  . Hyperlipidemia   . Low testosterone   . OSA (obstructive sleep apnea)   . Sciatica    Health Maintenance  Topic Date Due  . PNEUMOCOCCAL POLYSACCHARIDE VACCINE (1) 11/08/1960  . FOOT EXAM  10/28/2014  . HEMOGLOBIN A1C  06/09/2015  . OPHTHALMOLOGY EXAM  09/17/2015  . INFLUENZA VACCINE  10/11/2015  . URINE MICROALBUMIN  12/10/2015  . TETANUS/TDAP  02/09/2018  . COLONOSCOPY  06/28/2019  .  Hepatitis C Screening  Completed  . HIV Screening  Completed   Immunization History  Administered Date(s) Administered  . PPD Test 10/27/2013, 12/10/2014  . Tdap 02/10/2008   Past Surgical History:  Procedure Laterality Date  . APPENDECTOMY     Family History  Problem Relation Age of Onset  . Heart attack Father 57  . Heart attack Brother 96  . Heart attack Sister 29   Social History   Social History  . Marital status: Married    Spouse name: [name]  . Number of children: [name]  . Years of education: [name]   Occupational History  . Parts Manager     [employer]   Social History Main Topics  . Smoking status: Former Smoker    Quit date: 03/12/1986  . Smokeless tobacco: Not on file  . Alcohol use 0.0 oz/week     Comment: very rarely  . Drug use: Unknown  . Sexual activity:  Not on file   Social History Narrative   Lives in [place]    ROS Constitutional: Denies fever, chills, weight loss/gain, headaches, insomnia,  night sweats or change in appetite. Does c/o fatigue. Eyes: Denies redness, blurred vision, diplopia, discharge, itchy or watery eyes.  ENT: Denies discharge, congestion, post nasal drip, epistaxis, sore throat, earache, hearing loss, dental pain, Tinnitus, Vertigo, Sinus pain or snoring.  Cardio: Denies chest pain, palpitations, irregular heartbeat, syncope, dyspnea, diaphoresis, orthopnea, PND, claudication or edema Respiratory: denies cough, dyspnea, DOE, pleurisy, hoarseness, laryngitis or wheezing.  Gastrointestinal: Denies dysphagia, heartburn, reflux, water brash, pain, cramps, nausea, vomiting, bloating, diarrhea, constipation, hematemesis, melena, hematochezia, jaundice or hemorrhoids Genitourinary: Denies dysuria, frequency, urgency, nocturia, hesitancy, discharge, hematuria or flank pain Musculoskeletal: Denies arthralgia, myalgia, stiffness, Jt. Swelling, pain, limp or strain/sprain. Denies Falls. Skin: Denies puritis, rash, hives, warts, acne, eczema or change in skin lesion Neuro: No weakness, tremor, incoordination, spasms, paresthesia or pain Psychiatric: Denies confusion, memory loss or sensory loss. Denies Depression. Endocrine: Denies change in weight, skin, hair change, nocturia, and paresthesia, diabetic polys, visual blurring or hyper / hypo glycemic episodes.  Heme/Lymph: No excessive bleeding, bruising or enlarged lymph nodes.  Physical Exam  BP 118/82   Pulse 80   Temp 97.3 F (36.3 C)   Resp 16   Ht 5' 10.5" (1.791 m)   Wt 215 lb 6.4 oz (97.7 kg)   BMI 30.47 kg/m   General Appearance: Well nourished, in no apparent distress.  Eyes: PERRLA, EOMs, conjunctiva no swelling or erythema, normal fundi and vessels. Sinuses: No frontal/maxillary tenderness ENT/Mouth: EACs patent / TMs  nl. Nares clear without erythema,  swelling, mucoid exudates. Oral hygiene is good. No erythema, swelling, or exudate. Tongue normal, non-obstructing. Tonsils not swollen or erythematous. Hearing normal.  Neck: Supple, thyroid normal. No bruits, nodes or JVD. Respiratory: Respiratory effort normal.  BS equal and clear bilateral without rales, rhonci, wheezing or stridor. Cardio: Heart sounds are normal with regular rate and rhythm and no murmurs, rubs or gallops. Peripheral pulses are normal and equal bilaterally without edema. No aortic or femoral bruits. Chest: symmetric with normal excursions and percussion.  Abdomen: Soft, with Nl bowel sounds. Nontender, no guarding, rebound, hernias, masses, or organomegaly.  Lymphatics: Non tender without lymphadenopathy.  Genitourinary: No hernias.Testes nl. DRE - prostate nl for age - smooth & firm w/o nodules. Musculoskeletal: Full ROM all peripheral extremities, joint stability, 5/5 strength, and normal gait. Skin: Warm and dry without rashes, lesions, cyanosis, clubbing or  ecchymosis.  Neuro: Cranial nerves  intact, reflexes equal bilaterally. Normal muscle tone, no cerebellar symptoms. Sensation intact.  Pysch: Alert and oriented X 3 with normal affect, insight and judgment appropriate.   Assessment and Plan  1. Annual Preventative/Screening Exam    2. Labile hypertension  - EKG 12-Lead - [place], RETROPERITNL ABD,  LTD - Urinalysis, Routine w reflex microscopic - Microalbumin / creatinine urine ratio - CBC with Differential/Platelet - BASIC METABOLIC PANEL WITH GFR - TSH  3. Mixed hyperlipidemia  - EKG 12-Lead - [place], RETROPERITNL ABD,  LTD - Hepatic function panel - Lipid panel - TSH  4. Prediabetes  - EKG 12-Lead - [place], RETROPERITNL ABD,  LTD - Hemoglobin A1c - Insulin, random  5. Vitamin D deficiency  - VITAMIN D 25 Hydroxy   6. GERD   7. Screening for rectal cancer  - POC Hemoccult Bld/Stl   8. Prostate cancer screening  - PSA  9. Screening for AAA  (aortic abdominal aneurysm)  - [place], RETROPERITNL ABD,  LTD  10. Screening for ischemic heart disease  - EKG 12-Lead  11. Fatigue, unspecified type  - Vitamin B12 - Iron and TIBC - Testosterone - CBC with Differential/Platelet - TSH  12. Medication management  - Urinalysis, Routine w reflex microscopic - Microalbumin / creatinine urine ratio - CBC with Differential/Platelet - BASIC METABOLIC PANEL WITH GFR - Hepatic function panel - Magnesium - Lipid panel - VITAMIN D 25 Hydroxy   13. Screening examination for pulmonary tuberculosis  - PPD       Continue prudent diet as discussed, weight control, BP monitoring, regular exercise, and medications as discussed.  Discussed med effects and SE's. Routine screening labs and tests as requested with regular follow-up as recommended. Over 40 minutes of exam, counseling, chart review and high complex critical decision making was performed

## 2016-05-07 NOTE — Patient Instructions (Signed)

## 2016-05-08 ENCOUNTER — Encounter: Payer: Self-pay | Admitting: Internal Medicine

## 2016-05-08 ENCOUNTER — Ambulatory Visit (INDEPENDENT_AMBULATORY_CARE_PROVIDER_SITE_OTHER): Payer: 59 | Admitting: Internal Medicine

## 2016-05-08 VITALS — BP 118/82 | HR 80 | Temp 97.3°F | Resp 16 | Ht 70.5 in | Wt 215.4 lb

## 2016-05-08 DIAGNOSIS — I10 Essential (primary) hypertension: Secondary | ICD-10-CM | POA: Diagnosis not present

## 2016-05-08 DIAGNOSIS — Z111 Encounter for screening for respiratory tuberculosis: Secondary | ICD-10-CM

## 2016-05-08 DIAGNOSIS — R7303 Prediabetes: Secondary | ICD-10-CM

## 2016-05-08 DIAGNOSIS — Z136 Encounter for screening for cardiovascular disorders: Secondary | ICD-10-CM

## 2016-05-08 DIAGNOSIS — Z0000 Encounter for general adult medical examination without abnormal findings: Secondary | ICD-10-CM

## 2016-05-08 DIAGNOSIS — K2100 Gastro-esophageal reflux disease with esophagitis, without bleeding: Secondary | ICD-10-CM

## 2016-05-08 DIAGNOSIS — Z79899 Other long term (current) drug therapy: Secondary | ICD-10-CM

## 2016-05-08 DIAGNOSIS — R5383 Other fatigue: Secondary | ICD-10-CM

## 2016-05-08 DIAGNOSIS — Z125 Encounter for screening for malignant neoplasm of prostate: Secondary | ICD-10-CM

## 2016-05-08 DIAGNOSIS — E782 Mixed hyperlipidemia: Secondary | ICD-10-CM

## 2016-05-08 DIAGNOSIS — Z0001 Encounter for general adult medical examination with abnormal findings: Secondary | ICD-10-CM

## 2016-05-08 DIAGNOSIS — R0989 Other specified symptoms and signs involving the circulatory and respiratory systems: Secondary | ICD-10-CM

## 2016-05-08 DIAGNOSIS — Z1212 Encounter for screening for malignant neoplasm of rectum: Secondary | ICD-10-CM

## 2016-05-08 DIAGNOSIS — E559 Vitamin D deficiency, unspecified: Secondary | ICD-10-CM

## 2016-05-08 LAB — URINALYSIS, ROUTINE W REFLEX MICROSCOPIC
Bilirubin Urine: NEGATIVE
Glucose, UA: NEGATIVE
Hgb urine dipstick: NEGATIVE
Ketones, ur: NEGATIVE
Leukocytes, UA: NEGATIVE
Nitrite: NEGATIVE
Protein, ur: NEGATIVE
Specific Gravity, Urine: 1.024 (ref 1.001–1.035)
pH: 5.5 (ref 5.0–8.0)

## 2016-05-08 LAB — HEPATIC FUNCTION PANEL
ALT: 19 U/L (ref 9–46)
AST: 17 U/L (ref 10–35)
Albumin: 4 g/dL (ref 3.6–5.1)
Alkaline Phosphatase: 119 U/L — ABNORMAL HIGH (ref 40–115)
BILIRUBIN INDIRECT: 0.5 mg/dL (ref 0.2–1.2)
Bilirubin, Direct: 0.1 mg/dL (ref <=0.2)
TOTAL PROTEIN: 6.9 g/dL (ref 6.1–8.1)
Total Bilirubin: 0.6 mg/dL (ref 0.2–1.2)

## 2016-05-08 LAB — CBC WITH DIFFERENTIAL/PLATELET
Basophils Absolute: 65 {cells}/uL (ref 0–200)
Basophils Relative: 1 %
Eosinophils Absolute: 195 {cells}/uL (ref 15–500)
Eosinophils Relative: 3 %
HCT: 43.7 % (ref 38.5–50.0)
Hemoglobin: 14.4 g/dL (ref 13.2–17.1)
Lymphocytes Relative: 34 %
Lymphs Abs: 2210 {cells}/uL (ref 850–3900)
MCH: 29.8 pg (ref 27.0–33.0)
MCHC: 33 g/dL (ref 32.0–36.0)
MCV: 90.5 fL (ref 80.0–100.0)
MPV: 11.1 fL (ref 7.5–12.5)
Monocytes Absolute: 585 {cells}/uL (ref 200–950)
Monocytes Relative: 9 %
Neutro Abs: 3445 {cells}/uL (ref 1500–7800)
Neutrophils Relative %: 53 %
Platelets: 182 K/uL (ref 140–400)
RBC: 4.83 MIL/uL (ref 4.20–5.80)
RDW: 14.3 % (ref 11.0–15.0)
WBC: 6.5 K/uL (ref 3.8–10.8)

## 2016-05-08 LAB — IRON AND TIBC
%SAT: 20 % (ref 15–60)
Iron: 57 ug/dL (ref 50–180)
TIBC: 292 ug/dL (ref 250–425)
UIBC: 235 ug/dL (ref 125–400)

## 2016-05-08 LAB — LIPID PANEL
CHOLESTEROL: 222 mg/dL — AB (ref <200)
HDL: 43 mg/dL (ref >40)
LDL Cholesterol: 136 mg/dL — ABNORMAL HIGH (ref <100)
Total CHOL/HDL Ratio: 5.2 Ratio — ABNORMAL HIGH (ref <5.0)
Triglycerides: 213 mg/dL — ABNORMAL HIGH (ref <150)
VLDL: 43 mg/dL — AB (ref <30)

## 2016-05-08 LAB — BASIC METABOLIC PANEL WITH GFR
BUN: 14 mg/dL (ref 7–25)
CALCIUM: 8.9 mg/dL (ref 8.6–10.3)
CO2: 23 mmol/L (ref 20–31)
CREATININE: 1 mg/dL (ref 0.70–1.33)
Chloride: 106 mmol/L (ref 98–110)
GFR, EST NON AFRICAN AMERICAN: 83 mL/min (ref >=60)
GFR, Est African American: >89 mL/min (ref >=60)
GLUCOSE: 99 mg/dL (ref 65–99)
Potassium: 4.1 mmol/L (ref 3.5–5.3)
Sodium: 140 mmol/L (ref 135–146)

## 2016-05-08 LAB — TSH: TSH: 1.76 m[IU]/L (ref 0.40–4.50)

## 2016-05-09 ENCOUNTER — Other Ambulatory Visit: Payer: Self-pay | Admitting: Internal Medicine

## 2016-05-09 DIAGNOSIS — E782 Mixed hyperlipidemia: Secondary | ICD-10-CM

## 2016-05-09 LAB — PSA: PSA: 0.5 ng/mL (ref <=4.0)

## 2016-05-09 LAB — MAGNESIUM: Magnesium: 2.2 mg/dL (ref 1.5–2.5)

## 2016-05-09 LAB — TESTOSTERONE: TESTOSTERONE: 304 ng/dL (ref 250–827)

## 2016-05-09 LAB — VITAMIN D 25 HYDROXY (VIT D DEFICIENCY, FRACTURES): Vit D, 25-Hydroxy: 42 ng/mL (ref 30–100)

## 2016-05-09 LAB — MICROALBUMIN / CREATININE URINE RATIO
Creatinine, Urine: 269 mg/dL (ref 20–370)
Microalb Creat Ratio: 2 mcg/mg creat (ref <30)
Microalb, Ur: 0.5 mg/dL

## 2016-05-09 LAB — VITAMIN B12: Vitamin B-12: 392 pg/mL (ref 200–1100)

## 2016-05-09 LAB — HEMOGLOBIN A1C
HEMOGLOBIN A1C: 5.3 % (ref <5.7)
Mean Plasma Glucose: 105 mg/dL

## 2016-05-09 LAB — INSULIN, RANDOM: Insulin: 13.7 u[IU]/mL (ref 2.0–19.6)

## 2016-05-09 MED ORDER — ROSUVASTATIN CALCIUM 40 MG PO TABS: ORAL_TABLET | ORAL | 1 refills | Status: DC

## 2016-07-19 ENCOUNTER — Ambulatory Visit (INDEPENDENT_AMBULATORY_CARE_PROVIDER_SITE_OTHER): Payer: 59 | Admitting: Internal Medicine

## 2016-07-22 NOTE — Progress Notes (Signed)
NO SHOW

## 2016-07-27 ENCOUNTER — Ambulatory Visit (INDEPENDENT_AMBULATORY_CARE_PROVIDER_SITE_OTHER): Payer: 59 | Admitting: Internal Medicine

## 2016-07-27 ENCOUNTER — Encounter: Payer: Self-pay | Admitting: Internal Medicine

## 2016-07-27 VITALS — BP 126/80 | HR 84 | Temp 97.7°F | Resp 12 | Ht 70.5 in | Wt 223.0 lb

## 2016-07-27 DIAGNOSIS — G5711 Meralgia paresthetica, right lower limb: Secondary | ICD-10-CM

## 2016-07-27 MED ORDER — GABAPENTIN 100 MG PO CAPS: ORAL_CAPSULE | ORAL | 1 refills | Status: DC

## 2016-07-27 NOTE — Progress Notes (Signed)
  Subjective:    Patient ID: Erik Salas, male    DOB: [birth year], 56 y.o.   MRN: [id number]  HPI  This very nice 56 yo MWM with hx/o ;labile HTN, HLD, PreDM, GERD, Vit D Def presents with classic sx's of numbness, then burning paresthesias of in the distribution of the Rt Lateral Femoral cutaneous N. Over the last 2-3 weeks.   Medication Sig  . aspirin 81 MG EC tablet Take 81 mg by mouth daily.    . B Complex Vitamins (VITAMIN B COMPLEX PO) Take by mouth daily.  . Calcium Carbonate-Vit D-Min (CALCIUM 1200 PO) Take by mouth daily.  . Cholecalciferol (VITAMIN D PO) Take 5,000 Units by mouth daily.  . ferrous fumarate (HEMOCYTE - 106 MG FE) 325 (106 FE) MG TABS tablet Take 1 tablet by mouth.  Marland Kitchen. glucosamine-chondroitin 500-400 MG tablet Take 1 tablet by mouth 2 (two) times daily.  Marland Kitchen. glucose blood test strip Use as instructed (One Touch Ultra Teststrips) - check glucose tid  . KRILL OIL PO Take by mouth daily.  . Multiple Vitamin (MULTIVITAMIN) tablet Take 1 tablet by mouth daily.  . multivitamin-lutein (OCUVITE-LUTEIN) CAPS capsule Take 1 capsule by mouth daily.  . Omega-3 Fatty Acids (OMEGA 3 PO) Take by mouth daily.  Marland Kitchen. OVER THE COUNTER MEDICATION Herbal Life Prostrate Health  . rosuvastatin (CRESTOR) 40 MG tablet Take 1/2 to 1 tablet daily or as directed for Cholesterol   No Known Allergies   Past Medical History:  Diagnosis Date  . GERD (gastroesophageal reflux disease)   . History of ETT    cardiolite > 10 years ago was normal per patient's report  . Hyperlipidemia   . Low testosterone   . OSA (obstructive sleep apnea)   . Sciatica    Review of Systems  10 point systems review negative except as above.    Objective:   Physical Exam   BP 126/80   Pulse 84   Temp 97.7 F (36.5 C)   Resp 12   Ht 5' 10.5" (1.791 m)   Wt 223 lb (101.2 kg)   BMI 31.54 kg/m   HEENT - WNL. Neck - supple.  Chest - Clear equal BS. Cor - Nl HS. RRR w/o sig MGR. PP 1(+). No edema. MS- FROM  w/o deformities.  Gait Nl. Neuro -  Nl w/o focal abnormalities.  Skin - clear w/o rash, lesions, icterus or clubbing. (+) are of hyperalgesia to touch over the  anteriolateral Rt thigh.     Assessment & Plan:   1. Meralgia paresthetica of right side  - gabapentin (NEURONTIN) 100 MG capsule; Take 1 capsule 3 x / day for neuralgia pain  Dispense: 270 capsule; Refill: 1  - discussed Dx  And prudent diet & exercise w/patient.

## 2016-07-27 NOTE — Patient Instructions (Signed)
Meralgia paresthetica   Meralgia paresthetica is a condition characterized by tingling, numbness and burning pain in your outer thigh. The cause of meralgia paresthetica is compression of the nerve that supplies sensation to the skin surface of your thigh.  Tight clothing, obesity or weight gain, and pregnancy are common causes of meralgia paresthetica. However, meralgia paresthetica can also be due to local trauma or a disease, such as diabetes.   In most cases, you can relieve meralgia paresthetica with conservative measures, such as wearing looser clothing. In severe cases, treatment may include medications to relieve discomfort or, rarely, surgery.  Symptoms  Pressure on the lateral femoral cutaneous nerve, which supplies sensation to your upper thigh, might cause these symptoms of meralgia paresthetica: .Tingling and numbness in the outer (lateral) part of your thigh .Burning pain on the surface of the outer part of your thigh  These symptoms commonly occur on one side of your body and might intensify after walking or standing.  Causes  Meralgia paresthetica occurs when the lateral femoral cutaneous nerve - which supplies sensation to the surface of your outer thigh - becomes compressed, or pinched. The lateral femoral cutaneous nerve is purely a sensory nerve and doesn't affect your ability to use your leg muscles.  In most people, this nerve passes through the groin to the upper thigh without trouble. But in meralgia paresthetica, the lateral femoral cutaneous nerve becomes trapped - often under the inguinal ligament, which runs along your groin from your abdomen to your upper thigh.  Common causes of this compression include any condition that increases pressure on the groin, including:  .Tight clothing, such as belts, corsets and tight pants .Obesity or weight gain .Wearing a heavy tool belt .Pregnancy .Scar tissue near the inguinal ligament due to injury or past  surgery  Nerve injury, which can be due to diabetes or seat belt injury after a motor vehicle accident, for example, also can cause meralgia paresthetica.  Risk factors  The following might increase your risk of meralgia paresthetica: .Extra weight. Being overweight or obese can increase the pressure on your lateral femoral cutaneous nerve. .Pregnancy. A growing belly puts added pressure on your groin, through which the lateral femoral cutaneous nerve passes. .Diabetes. Diabetes-related nerve injury can lead to meralgia paresthetica. .Age. People between the ages of 830 and 960 are at a higher risk.

## 2016-08-12 NOTE — Progress Notes (Signed)
Patient ID: Erik Salas, male   DOB: 1960/09/17, 56 y.o.   MRN: [id number]  Assessment and Plan:  Hypertension:  -Continue medication,  -monitor blood pressure at home.  -Continue DASH diet.   -Reminder to go to the ER if any CP, SOB, nausea, dizziness, severe HA, changes vision/speech, left arm numbness and tingling, and jaw pain.  Cholesterol: -Continue diet and exercise.  -Check cholesterol.   Pre-diabetes: -Continue diet and exercise.  -Check A1C  Vitamin D Def: -check level -continue medications.   Morbid Obesity with co morbidities - long discussion about weight loss, diet, and exercise - stressed getting back on CPAP.   Continue diet and meds as discussed. Further disposition pending results of labs. Future Appointments Date Time Provider Department Center  11/19/2016 11:00 AM Lucky Cowboy, MD [facility] None  05/30/2017 9:00 AM Lucky Cowboy, MD [facility] None    HPI 56 y.o. male  presents for 3 month follow up with hypertension, hyperlipidemia, prediabetes and vitamin D.   His blood pressure has been controlled at home, today their BP is BP: 122/82.   He does workout.  He is doing strength training and running 4-5 days weekly.  He denies chest pain, shortness of breath, dizziness.   He is not on cholesterol medication, crestor 40mg  1/2 every other day and denies myalgias. His cholesterol is not at goal. The cholesterol last visit was:   Lab Results  Component Value Date   CHOL 222 (H) 05/08/2016   HDL 43 05/08/2016   LDLCALC 136 (H) 05/08/2016   TRIG 213 (H) 05/08/2016   CHOLHDL 5.2 (H) 05/08/2016  He does not want to be on medications. He would prefer to be controlled with diet and exercise. He is on gabapentin 100mg  twice a day and this is helping with his leg pain.   He has been working on diet and exercise for prediabetes, and denies foot ulcerations, nausea, paresthesia of the feet, polydipsia, polyuria, visual disturbances, vomiting and weight loss.  Last A1C in the office was:  Lab Results  Component Value Date   HGBA1C 5.3 05/08/2016   Patient is on Vitamin D supplement.  Lab Results  Component Value Date   VD25OH 40 05/08/2016     He has a history of testosterone deficiency he is on zinc 50mg . He states that the testosterone helps with his energy, libido, muscle mass. Lab Results  Component Value Date   TESTOSTERONE 304 05/08/2016   BMI is Body mass index is 31.49 kg/m., he is working on diet and exercise. He has OSA and not on CPAP.  Wt Readings from Last 3 Encounters:  08/14/16 222 lb 9.6 oz (101 kg)  07/27/16 223 lb (101.2 kg)  05/08/16 215 lb 6.4 oz (97.7 kg)    Current Medications:  Current Outpatient Prescriptions on File Prior to Visit  Medication Sig Dispense Refill  . aspirin 81 MG EC tablet Take 81 mg by mouth daily.      . B Complex Vitamins (VITAMIN B COMPLEX PO) Take by mouth daily.    . Calcium Carbonate-Vit D-Min (CALCIUM 1200 PO) Take by mouth daily.    . Cholecalciferol (VITAMIN D PO) Take 5,000 Units by mouth daily.    . ferrous fumarate (HEMOCYTE - 106 MG FE) 325 (106 FE) MG TABS tablet Take 1 tablet by mouth.    . gabapentin (NEURONTIN) 100 MG capsule Take 1 capsule 3 x / day for neuralgia pain 270 capsule 1  . glucosamine-chondroitin 500-400 MG tablet Take 1  tablet by mouth 2 (two) times daily.    Marland Kitchen. KRILL OIL PO Take by mouth daily.    . Multiple Vitamin (MULTIVITAMIN) tablet Take 1 tablet by mouth daily.    . Omega-3 Fatty Acids (OMEGA 3 PO) Take by mouth daily.    Marland Kitchen. OVER THE COUNTER MEDICATION Herbal Life Prostrate Health    . rosuvastatin (CRESTOR) 40 MG tablet Take 1/2 to 1 tablet daily or as directed for Cholesterol 90 tablet 1   No current facility-administered medications on file prior to visit.     Medical History:  Past Medical History  Diagnosis Date  . Hyperlipidemia   . Low testosterone   . Sciatica   . GERD (gastroesophageal reflux disease)   . OSA (obstructive sleep apnea)    . DM (diabetes mellitus), gestational, diet controlled   . History of ETT     cardiolite > 10 years ago was normal per patient's report    Allergies: No Known Allergies   Review of Systems:  Review of Systems  Constitutional: Negative for chills, diaphoresis and fever.  HENT: Negative for congestion, ear pain, hearing loss, nosebleeds, sore throat and tinnitus.   Eyes: Negative.   Respiratory: Negative for cough, sputum production, shortness of breath and wheezing.   Cardiovascular: Negative for chest pain, palpitations, claudication and leg swelling.  Gastrointestinal: Positive for heartburn. Negative for blood in stool, constipation, diarrhea, melena, nausea and vomiting.  Genitourinary: Negative.   Musculoskeletal: Negative.   Skin: Negative.   Neurological: Negative.  Negative for weakness and headaches.  Psychiatric/Behavioral: Negative.     Family history- Review and unchanged  Social history- Review and unchanged  Physical Exam: BP 122/82   Pulse 66   Temp 97.3 F (36.3 C)   Resp 14   Ht 5' 10.5" (1.791 m)   Wt 222 lb 9.6 oz (101 kg)   SpO2 97%   BMI 31.49 kg/m  Wt Readings from Last 3 Encounters:  08/14/16 222 lb 9.6 oz (101 kg)  07/27/16 223 lb (101.2 kg)  05/08/16 215 lb 6.4 oz (97.7 kg)    General Appearance: Well nourished well developed, in no apparent distress. Eyes: PERRLA, EOMs, conjunctiva no swelling or erythema ENT/Mouth: Ear canals normal without obstruction, swelling, erythma, discharge.  TMs normal bilaterally.  Oropharynx moist, clear, without exudate, or postoropharyngeal swelling. Neck: Supple, thyroid normal,no cervical adenopathy  Respiratory: Respiratory effort normal, Breath sounds clear A&P without rhonchi, wheeze, or rale.  No retractions, no accessory usage. Cardio: RRR with no MRGs. Brisk peripheral pulses without edema.  Abdomen: Soft, + BS,  Non tender, no guarding, rebound, hernias, masses. Musculoskeletal: Full ROM, 5/5  strength, Normal gait Skin: Warm, dry without rashes, lesions, ecchymosis.  Neuro: Awake and oriented X 3, Cranial nerves intact. Normal muscle tone, no cerebellar symptoms. Psych: Normal affect, Insight and Judgment appropriate.    Quentin MullingAmanda Aleisha Paone, PA-C 10:27 AM [facility]

## 2016-08-14 ENCOUNTER — Encounter: Payer: Self-pay | Admitting: Physician Assistant

## 2016-08-14 ENCOUNTER — Ambulatory Visit (INDEPENDENT_AMBULATORY_CARE_PROVIDER_SITE_OTHER): Payer: 59 | Admitting: Physician Assistant

## 2016-08-14 ENCOUNTER — Ambulatory Visit: Payer: Self-pay | Admitting: Physician Assistant

## 2016-08-14 VITALS — BP 122/82 | HR 66 | Temp 97.3°F | Resp 14 | Ht 70.5 in | Wt 222.6 lb

## 2016-08-14 DIAGNOSIS — Z79899 Other long term (current) drug therapy: Secondary | ICD-10-CM | POA: Diagnosis not present

## 2016-08-14 DIAGNOSIS — R7303 Prediabetes: Secondary | ICD-10-CM | POA: Diagnosis not present

## 2016-08-14 DIAGNOSIS — E349 Endocrine disorder, unspecified: Secondary | ICD-10-CM | POA: Diagnosis not present

## 2016-08-14 DIAGNOSIS — E669 Obesity, unspecified: Secondary | ICD-10-CM | POA: Diagnosis not present

## 2016-08-14 DIAGNOSIS — I10 Essential (primary) hypertension: Secondary | ICD-10-CM

## 2016-08-14 DIAGNOSIS — E782 Mixed hyperlipidemia: Secondary | ICD-10-CM | POA: Diagnosis not present

## 2016-08-14 LAB — CBC WITH DIFFERENTIAL/PLATELET
BASOS ABS: 70 {cells}/uL (ref 0–200)
Basophils Relative: 1 %
EOS ABS: 210 {cells}/uL (ref 15–500)
Eosinophils Relative: 3 %
HCT: 46.4 % (ref 38.5–50.0)
Hemoglobin: 15.6 g/dL (ref 13.2–17.1)
LYMPHS PCT: 34 %
Lymphs Abs: 2380 cells/uL (ref 850–3900)
MCH: 30.6 pg (ref 27.0–33.0)
MCHC: 33.6 g/dL (ref 32.0–36.0)
MCV: 91 fL (ref 80.0–100.0)
MONOS PCT: 9 %
MPV: 11.3 fL (ref 7.5–12.5)
Monocytes Absolute: 630 cells/uL (ref 200–950)
NEUTROS ABS: 3710 {cells}/uL (ref 1500–7800)
NEUTROS PCT: 53 %
PLATELETS: 164 10*3/uL (ref 140–400)
RBC: 5.1 MIL/uL (ref 4.20–5.80)
RDW: 13.8 % (ref 11.0–15.0)
WBC: 7 10*3/uL (ref 3.8–10.8)

## 2016-08-14 NOTE — Patient Instructions (Addendum)
Tumeric with black pepper extract is a great natural antiinflammatory that helps with arthritis and aches and pain. Can get from [employer] or any health food store. Need to take at least 800mg  twice a day with food.   9 Ways to Naturally Increase Testosterone Levels  1.   Lose Weight If you're overweight, shedding the excess pounds may increase your testosterone levels, according to research presented at the Endocrine Society's 2012 meeting. Overweight men are more likely to have low testosterone levels to begin with, so this is an important trick to increase your body's testosterone production when you need it most.  2.   High-Intensity Exercise like Peak Fitness  Short intense exercise has a proven positive effect on increasing testosterone levels and preventing its decline. That's unlike aerobics or prolonged moderate exercise, which have shown to have negative or no effect on testosterone levels. Having a whey protein meal after exercise can further enhance the satiety/testosterone-boosting impact (hunger hormones cause the opposite effect on your testosterone and libido). Here's a summary of what a typical high-intensity Peak Fitness routine might look like: " Warm up for three minutes  " Exercise as hard and fast as you can for 30 seconds. You should feel like you couldn't possibly go on another few seconds  " Recover at a slow to moderate pace for 90 seconds  " Repeat the high intensity exercise and recovery 7 more times .  3.   Consume Plenty of Zinc The mineral zinc is important for testosterone production, and supplementing your diet for as little as six weeks has been shown to cause a marked improvement in testosterone among men with low levels.1 Likewise, research has shown that restricting dietary sources of zinc leads to a significant decrease in testosterone, while zinc supplementation increases it2 -- and even protects men from exercised-induced reductions in testosterone levels.3 It's  estimated that up to 45 percent of adults over the age of 60 may have lower than recommended zinc intakes; even when dietary supplements were added in, an estimated 20-25 percent of older adults still had inadequate zinc intakes, according to a [employer] and Nutrition Examination Survey.4 Your diet is the best source of zinc; along with protein-rich foods like meats and fish, other good dietary sources of zinc include raw milk, raw cheese, beans, and yogurt or kefir made from raw milk. It can be difficult to obtain enough dietary zinc if you're a vegetarian, and also for meat-eaters as well, largely because of conventional farming methods that rely heavily on chemical fertilizers and pesticides. These chemicals deplete the soil of nutrients ... nutrients like zinc that must be absorbed by plants in order to be passed on to you. In many cases, you may further deplete the nutrients in your food by the way you prepare it. For most food, cooking it will drastically reduce its levels of nutrients like zinc . particularly over-cooking, which many people do. If you decide to use a zinc supplement, stick to a dosage of less than 40 mg a day, as this is the recommended adult upper limit. Taking too much zinc can interfere with your body's ability to absorb other minerals, especially copper, and may cause nausea as a side effect.  4.   Strength Training In addition to Peak Fitness, strength training is also known to boost testosterone levels, provided you are doing so intensely enough. When strength training to boost testosterone, you'll want to increase the weight and lower your number of reps, and then focus  on exercises that work a large number of muscles, such as dead lifts or squats.  You can "turbo-charge" your weight training by going slower. By slowing down your movement, you're actually turning it into a high-intensity exercise. Super Slow movement allows your muscle, at the microscopic level, to access  the maximum number of cross-bridges between the protein filaments that produce movement in the muscle.   5.   Optimize Your Vitamin D Levels Vitamin D, a steroid hormone, is essential for the healthy development of the nucleus of the sperm cell, and helps maintain semen quality and sperm count. Vitamin D also increases levels of testosterone, which may boost libido. In one study, overweight men who were given vitamin D supplements had a significant increase in testosterone levels after one year.5   6.   Reduce Stress When you're under a lot of stress, your body releases high levels of the stress hormone cortisol. This hormone actually blocks the effects of testosterone,6 presumably because, from a biological standpoint, testosterone-associated behaviors (mating, competing, aggression) may have lowered your chances of survival in an emergency (hence, the "fight or flight" response is dominant, courtesy of cortisol).  7.   Limit or Eliminate Sugar from Your Diet Testosterone levels decrease after you eat sugar, which is likely because the sugar leads to a high insulin level, another factor leading to low testosterone.7 Based on USDA estimates, the average American consumes 12 teaspoons of sugar a day, which equates to about TWO TONS of sugar during a lifetime.  8.   Eat Healthy Fats By healthy, this means not only mon- and polyunsaturated fats, like that found in avocadoes and nuts, but also saturated, as these are essential for building testosterone. Research shows that a diet with less than 40 percent of energy as fat (and that mainly from animal sources, i.e. saturated) lead to a decrease in testosterone levels.8 My personal diet is about 60-70 percent healthy fat, and other experts agree that the ideal diet includes somewhere between 50-70 percent fat.  It's important to understand that your body requires saturated fats from animal and vegetable sources (such as meat, dairy, certain oils, and  tropical plants like coconut) for optimal functioning, and if you neglect this important food group in favor of sugar, grains and other starchy carbs, your health and weight are almost guaranteed to suffer. Examples of healthy fats you can eat more of to give your testosterone levels a boost include: Olives and Olive oil  Coconuts and coconut oil Butter made from raw grass-fed organic milk Raw nuts, such as, almonds or pecans Organic pastured egg yolks Avocados Grass-fed meats Palm oil Unheated organic nut oils   9.   Boost Your Intake of Branch Chain Amino Acids (BCAA) from Foods Like Whey Protein Research suggests that BCAAs result in higher testosterone levels, particularly when taken along with resistance training.9 While BCAAs are available in supplement form, you'll find the highest concentrations of BCAAs like leucine in dairy products - especially quality cheeses and whey protein. Even when getting leucine from your natural food supply, it's often wasted or used as a building block instead of an anabolic agent. So to create the correct anabolic environment, you need to boost leucine consumption way beyond mere maintenance levels. That said, keep in mind that using leucine as a free form amino acid can be highly counterproductive as when free form amino acids are artificially administrated, they rapidly enter your circulation while disrupting insulin function, and impairing your body's glycemic control. Food-based  leucine is really the ideal form that can benefit your muscles without side effects.   I think it is possible that you have sleep apnea. It can cause interrupted sleep, headaches, frequent awakenings, fatigue, dry mouth, fast/slow heart beats, memory issues, anxiety/depression, swelling, numbness tingling hands/feet, weight gain, shortness of breath, and the list goes on. Sleep apnea needs to be ruled out because if it is left untreated it does eventually lead to abnormal heart beats, lung  failure or heart failure as well as increasing the risk of heart attack and stroke. There are masks you can wear OR a mouth piece that I can give you information about. Often times though people feel MUCH better after getting treatment.   Sleep Apnea  Sleep apnea is a sleep disorder characterized by abnormal pauses in breathing while you sleep. When your breathing pauses, the level of oxygen in your blood decreases. This causes you to move out of deep sleep and into light sleep. As a result, your quality of sleep is poor, and the system that carries your blood throughout your body (cardiovascular system) experiences stress. If sleep apnea remains untreated, the following conditions can develop:  High blood pressure (hypertension).  Coronary artery disease.  Inability to achieve or maintain an erection (impotence).  Impairment of your thought process (cognitive dysfunction). There are three types of sleep apnea: 1. Obstructive sleep apnea--Pauses in breathing during sleep because of a blocked airway. 2. Central sleep apnea--Pauses in breathing during sleep because the area of the brain that controls your breathing does not send the correct signals to the muscles that control breathing. 3. Mixed sleep apnea--A combination of both obstructive and central sleep apnea.  RISK FACTORS The following risk factors can increase your risk of developing sleep apnea:  Being overweight.  Smoking.  Having narrow passages in your nose and throat.  Being of older age.  Being male.  Alcohol use.  Sedative and tranquilizer use.  Ethnicity. Among individuals younger than 35 years, African Americans are at increased risk of sleep apnea. SYMPTOMS   Difficulty staying asleep.  Daytime sleepiness and fatigue.  Loss of energy.  Irritability.  Loud, heavy snoring.  Morning headaches.  Trouble concentrating.  Forgetfulness.  Decreased interest in sex. DIAGNOSIS  In order to diagnose sleep  apnea, your caregiver will perform a physical examination. Your caregiver may suggest that you take a home sleep test. Your caregiver may also recommend that you spend the night in a sleep lab. In the sleep lab, several monitors record information about your heart, lungs, and brain while you sleep. Your leg and arm movements and blood oxygen level are also recorded. TREATMENT The following actions may help to resolve mild sleep apnea:  Sleeping on your side.   Using a decongestant if you have nasal congestion.   Avoiding the use of depressants, including alcohol, sedatives, and narcotics.   Losing weight and modifying your diet if you are overweight. There also are devices and treatments to help open your airway:  Oral appliances. These are custom-made mouthpieces that shift your lower jaw forward and slightly open your bite. This opens your airway.  Devices that create positive airway pressure. This positive pressure "splints" your airway open to help you breathe better during sleep. The following devices create positive airway pressure:  Continuous positive airway pressure (CPAP) device. The CPAP device creates a continuous level of air pressure with an air pump. The air is delivered to your airway through a mask while you sleep. This  continuous pressure keeps your airway open.  Nasal expiratory positive airway pressure (EPAP) device. The EPAP device creates positive air pressure as you exhale. The device consists of single-use valves, which are inserted into each nostril and held in place by adhesive. The valves create very little resistance when you inhale but create much more resistance when you exhale. That increased resistance creates the positive airway pressure. This positive pressure while you exhale keeps your airway open, making it easier to breath when you inhale again.  Bilevel positive airway pressure (BPAP) device. The BPAP device is used mainly in patients with central sleep  apnea. This device is similar to the CPAP device because it also uses an air pump to deliver continuous air pressure through a mask. However, with the BPAP machine, the pressure is set at two different levels. The pressure when you exhale is lower than the pressure when you inhale.  Surgery. Typically, surgery is only done if you cannot comply with less invasive treatments or if the less invasive treatments do not improve your condition. Surgery involves removing excess tissue in your airway to create a wider passage way. Document Released: 02/16/2002 Document Revised: 06/23/2012 Document Reviewed: 07/05/2011 [facility] Patient Information 2015 [place], [place]. This information is not intended to replace advice given to you by your health care provider. Make sure you discuss any questions you have with your health care provider.      Simple math prevails.    1st - exercise does not produce significant weight loss - at best one converts fat into muscle , "bulks up", loses inches, but usually stays "weight neutral"     2nd - think of your body weightas a check book: If you eat more calories than you burn up - you save money or gain weight .... Or if you spend more money than you put in the check book, ie burn up more calories than you eat, then you lose weight     3rd - if you walk or run 1 mile, you burn up 100 calories - you have to burn up 3,500 calories to lose 1 pound, ie you have to walk/run 35 miles to lose 1 measly pound. So if you want to lose 10 #, then you have to walk/run 350 miles, so.... clearly exercise is not the solution.     4. So if you consume 1,500 calories, then you have to burn up the equivalent of 15 miles to stay weight neutral - It also stands to reason that if you consume 1,500 cal/day and don't lose weight, then you must be burning up about 1,500 cals/day to stay weight neutral.     5. If you really want to lose weight, you must cut your calorie intake 300 calories /day  and at that rate you should lose about 1 # every 3 days.   6. Please purchase Dr Francis Dowse Fuhrman's book(s) "The End of Dieting" & "Eat to Live" . It has some great concepts and recipes.

## 2016-08-15 LAB — BASIC METABOLIC PANEL WITH GFR
BUN: 15 mg/dL (ref 7–25)
CALCIUM: 9.6 mg/dL (ref 8.6–10.3)
CHLORIDE: 102 mmol/L (ref 98–110)
CO2: 26 mmol/L (ref 20–31)
CREATININE: 1.05 mg/dL (ref 0.70–1.33)
GFR, Est African American: >89 mL/min (ref >=60)
GFR, Est Non African American: 78 mL/min (ref >=60)
Glucose, Bld: 118 mg/dL — ABNORMAL HIGH (ref 65–99)
Potassium: 4.4 mmol/L (ref 3.5–5.3)
Sodium: 140 mmol/L (ref 135–146)

## 2016-08-15 LAB — HEPATIC FUNCTION PANEL
ALBUMIN: 4.3 g/dL (ref 3.6–5.1)
ALT: 32 U/L (ref 9–46)
AST: 28 U/L (ref 10–35)
Alkaline Phosphatase: 116 U/L — ABNORMAL HIGH (ref 40–115)
Bilirubin, Direct: 0.2 mg/dL (ref <=0.2)
Indirect Bilirubin: 0.6 mg/dL (ref 0.2–1.2)
Total Bilirubin: 0.8 mg/dL (ref 0.2–1.2)
Total Protein: 7.1 g/dL (ref 6.1–8.1)

## 2016-08-15 LAB — LIPID PANEL
Cholesterol: 177 mg/dL (ref <200)
HDL: 52 mg/dL (ref >40)
LDL Cholesterol: 95 mg/dL (ref <100)
Total CHOL/HDL Ratio: 3.4 Ratio (ref <5.0)
Triglycerides: 148 mg/dL (ref <150)
VLDL: 30 mg/dL (ref <30)

## 2016-08-15 LAB — MAGNESIUM: Magnesium: 2.3 mg/dL (ref 1.5–2.5)

## 2016-08-15 LAB — TSH: TSH: 1.4 m[IU]/L (ref 0.40–4.50)

## 2016-11-19 ENCOUNTER — Ambulatory Visit: Payer: Self-pay | Admitting: Internal Medicine

## 2017-02-03 DIAGNOSIS — Z23 Encounter for immunization: Secondary | ICD-10-CM | POA: Diagnosis not present

## 2017-04-01 DIAGNOSIS — E119 Type 2 diabetes mellitus without complications: Secondary | ICD-10-CM | POA: Diagnosis not present

## 2017-04-01 LAB — HM DIABETES EYE EXAM

## 2017-04-04 ENCOUNTER — Encounter: Payer: Self-pay | Admitting: *Deleted

## 2017-05-30 ENCOUNTER — Encounter: Payer: Self-pay | Admitting: Internal Medicine

## 2017-06-06 ENCOUNTER — Encounter: Payer: Self-pay | Admitting: Podiatry

## 2017-06-06 ENCOUNTER — Ambulatory Visit (INDEPENDENT_AMBULATORY_CARE_PROVIDER_SITE_OTHER): Payer: 59

## 2017-06-06 ENCOUNTER — Ambulatory Visit: Payer: 59 | Admitting: Podiatry

## 2017-06-06 VITALS — BP 123/75 | HR 70 | Resp 16

## 2017-06-06 DIAGNOSIS — M722 Plantar fascial fibromatosis: Secondary | ICD-10-CM | POA: Diagnosis not present

## 2017-06-06 MED ORDER — METHYLPREDNISOLONE 4 MG PO TBPK: ORAL_TABLET | ORAL | 0 refills | Status: DC

## 2017-06-06 MED ORDER — MELOXICAM 15 MG PO TABS: 15.0000 mg | ORAL_TABLET | Freq: Every day | ORAL | 3 refills | Status: DC

## 2017-06-06 NOTE — Progress Notes (Signed)
Subjective:  Patient ID: Erik Salas, male    DOB: Mar 13, 1960,  MRN: [id number] HPI Chief Complaint  Patient presents with  . Foot Pain    Plantar heel left - aching x 2 months, AM pain, tried gel insoles-helps, some lateral side pain  . New Patient (Initial Visit)    57 y.o. male presents with the above complaint.   ROS: Denies fever chills nausea vomiting muscle aches pains calf pain shortness of breath.  Past Medical History:  Diagnosis Date  . GERD (gastroesophageal reflux disease)   . History of ETT    cardiolite > 10 years ago was normal per patient's report  . Hyperlipidemia   . Low testosterone   . OSA (obstructive sleep apnea)   . Sciatica    Past Surgical History:  Procedure Laterality Date  . APPENDECTOMY      Current Outpatient Medications:  .  aspirin 81 MG EC tablet, Take 81 mg by mouth daily.  , Disp: , Rfl:  .  B Complex Vitamins (VITAMIN B COMPLEX PO), Take by mouth daily., Disp: , Rfl:  .  Calcium Carbonate-Vit D-Min (CALCIUM 1200 PO), Take by mouth daily., Disp: , Rfl:  .  Cholecalciferol (VITAMIN D PO), Take 5,000 Units by mouth daily., Disp: , Rfl:  .  ferrous fumarate (HEMOCYTE - 106 MG FE) 325 (106 FE) MG TABS tablet, Take 1 tablet by mouth., Disp: , Rfl:  .  glucosamine-chondroitin 500-400 MG tablet, Take 1 tablet by mouth 2 (two) times daily., Disp: , Rfl:  .  KRILL OIL PO, Take by mouth daily., Disp: , Rfl:  .  meloxicam (MOBIC) 15 MG tablet, Take 1 tablet (15 mg total) by mouth daily., Disp: 30 tablet, Rfl: 3 .  methylPREDNISolone (MEDROL DOSEPAK) 4 MG TBPK tablet, 6 day dose pack - take as directed, Disp: 21 tablet, Rfl: 0 .  Multiple Vitamin (MULTIVITAMIN) tablet, Take 1 tablet by mouth daily., Disp: , Rfl:  .  Omega-3 Fatty Acids (OMEGA 3 PO), Take by mouth daily., Disp: , Rfl:  .  OVER THE COUNTER MEDICATION, [employer] Prostrate Health, Disp: , Rfl:  .  rosuvastatin (CRESTOR) 40 MG tablet, Take 1/2 to 1 tablet daily or as directed for  Cholesterol, Disp: 90 tablet, Rfl: 1  No Known Allergies Review of Systems Objective:   Vitals:   06/06/17 1123  BP: 123/75  Pulse: 70  Resp: 16    General: Well developed, nourished, in no acute distress, alert and oriented x3   Dermatological: Skin is warm, dry and supple bilateral. Nails x 10 are well maintained; remaining integument appears unremarkable at this time. There are no open sores, no preulcerative lesions, no rash or signs of infection present.  Vascular: Dorsalis Pedis artery and Posterior Tibial artery pedal pulses are 2/4 bilateral with immedate capillary fill time. Pedal hair growth present. No varicosities and no lower extremity edema present bilateral.   Neruologic: Grossly intact via light touch bilateral. Vibratory intact via tuning fork bilateral. Protective threshold with Semmes Wienstein monofilament intact to all pedal sites bilateral. Patellar and Achilles deep tendon reflexes 2+ bilateral. No Babinski or clonus noted bilateral.   Musculoskeletal: No gross boney pedal deformities bilateral. No pain, crepitus, or limitation noted with foot and ankle range of motion bilateral. Muscular strength 5/5 in all groups tested bilateral.  Gait: Unassisted, Nonantalgic.    Radiographs:  Soft tissue increase in density of plantar fascial calcaneal insertion site of the left heel.  No fractures are identified.  No acute findings.  Assessment & Plan:   Assessment: Plantar fasciitis left foot times 2 months.  Plan: Discussed etiology pathology conservative versus surgical therapies.  At this point performed a injection consisting of 20 mg of Kenalog 5 mg Marcaine to the point of maximal tenderness medial aspect of the heel at the plantar fascial calcaneal insertion site after sterile Betadine skin prep and verbal confirmation.  Also placed him in a plantar fascial brace and night splint as well as a prescription for methylprednisolone and Mobic.  He is provided both  oral and written home-going instructions for stretching and I will follow-up with him in 1 month.     Yashvi Jasinski T. [place]amaqua[place]att, [place] Dakota[place]PM

## 2017-06-06 NOTE — Patient Instructions (Signed)

## 2017-06-16 NOTE — Progress Notes (Signed)
[facility]   Lucky Cowboy, M.D.     [name]. [name], P.A.-C Judd Gaudier, DNP [facility]                [address]                [place], [place]. [address] Telephone [phone] Telefax [phone] Annual  Screening/Preventative Visit  & Comprehensive Evaluation & Examination     This very nice 57 y.o. MWM presents for a Screening/Preventative Visit & comprehensive evaluation and management of multiple medical co-morbidities.  Patient has been followed for Labile HTN, HLD, Prediabetes and Vitamin D Deficiency. Patient has hx/o OSA on CPAP "cured" with 35# weight loss and was able to stop CPAP in 2014 with wife reporting no more snoring or apneic spells. He also has hx/o GERD controlled with diet.      Patient has been followed expectantly since 2013 with labile HTN. Patient's BP has been controlled at home.  Today's BP is at goal - 118/84. Cardiolite scan in 2013 was Negative. Patient denies any cardiac symptoms as chest pain, palpitations, shortness of breath, dizziness or ankle swelling.     Patient's hyperlipidemia is controlled with diet and medications. Patient denies myalgias or other medication SE's. Last lipids were at goal: Lab Results  Component Value Date   CHOL 177 08/14/2016   HDL 52 08/14/2016   LDLCALC 95 08/14/2016   TRIG 148 08/14/2016   CHOLHDL 3.4 08/14/2016      Patient has prediabetes (A1c 5.7%/2009) and patient denies reactive hypoglycemic symptoms, visual blurring, diabetic polys or paresthesias. With weight loss last A1c was at goal:  Lab Results  Component Value Date   HGBA1C 5.3 05/08/2016        Patient has hx/o Low Testosterone of "220 "/2012 and deferred treatment.Finally, patient has history of Vitamin D Deficiency ("39"/2016) and last vitamin D was still low:   Lab Results  Component Value Date   VD25OH 42 05/08/2016   Current Outpatient Medications on File Prior to  Visit  Medication Sig  . aspirin 81 MG EC tablet Take 81 mg by mouth daily.    . B Complex Vitamins (VITAMIN B COMPLEX PO) Take by mouth daily.  . Calcium Carbonate-Vit D-Min (CALCIUM 1200 PO) Take by mouth daily.  . Cholecalciferol (VITAMIN D PO) Take 5,000 Units by mouth daily.  . ferrous fumarate (HEMOCYTE - 106 MG FE) 325 (106 FE) MG TABS tablet Take 1 tablet by mouth.  Marland Kitchen glucosamine-chondroitin 500-400 MG tablet Take 1 tablet by mouth 2 (two) times daily.  Marland Kitchen KRILL OIL PO Take by mouth daily.  . meloxicam (MOBIC) 15 MG tablet Take 1 tablet (15 mg total) by mouth daily.  . Multiple Vitamin (MULTIVITAMIN) tablet Take 1 tablet by mouth daily.  . Omega-3 Fatty Acids (OMEGA 3 PO) Take by mouth daily.  Marland Kitchen OVER THE COUNTER MEDICATION Herbal Life Prostrate Health  . rosuvastatin (CRESTOR) 40 MG tablet Take 1/2 to 1 tablet daily or as directed for Cholesterol   No current facility-administered medications on file prior to visit.    No Known Allergies   Past Medical History:  Diagnosis Date  . GERD (gastroesophageal reflux disease)   . History of ETT    cardiolite > 10 years ago was normal per patient's report  . Hyperlipidemia   . Low testosterone   . OSA (obstructive sleep apnea)   . Sciatica    Health Maintenance  Topic Date Due  . PNEUMOCOCCAL POLYSACCHARIDE VACCINE (2) 01/11/2013  . FOOT EXAM  10/28/2014  . HEMOGLOBIN A1C  11/05/2016  . URINE MICROALBUMIN  05/08/2017  . INFLUENZA VACCINE  10/10/2017  . TETANUS/TDAP  02/09/2018  . OPHTHALMOLOGY EXAM  04/01/2018  . COLONOSCOPY  06/28/2019  . Hepatitis C Screening  Completed  . HIV Screening  Completed   Immunization History  Administered Date(s) Administered  . Hepatitis B, ped/adol 09/02/2013, 10/01/2013, 01/20/2014  . PPD Test 10/27/2013, 12/10/2014, 05/08/2016  . Pneumococcal Polysaccharide-23 01/12/2008  . Tdap 02/10/2008   Last Colon - 04.18.2011 - Dr Kinnie Scales recc 10 yr F/U in Apr/2021  Past Surgical History:   Procedure Laterality Date  . APPENDECTOMY     Family History  Problem Relation Age of Onset  . Heart attack Father 27  . Heart attack Brother 27  . Heart attack Sister 33   Social History   Socioeconomic History  . Marital status: Married    Spouse name: Clydie Braun  . Number of children: 2 daughters - 3 Gsons  Occupational History  . Occupation: Facilities manager - [employer]  Tobacco Use  . Smoking status: Former Smoker    Last attempt to quit: 03/12/1986    Years since quitting: 31.2  . Smokeless tobacco: Never Used  Substance and Sexual Activity  . Alcohol use: Yes    Alcohol/week: 0.0 oz    Comment: very rarely  . Drug use: Not on file  . Sexual activity: Not on file  Social History Narrative   Lives in [place]    ROS Constitutional: Denies fever, chills, weight loss/gain, headaches, insomnia,  night sweats or change in appetite. Does c/o fatigue. Eyes: Denies redness, blurred vision, diplopia, discharge, itchy or watery eyes.  ENT: Denies discharge, congestion, post nasal drip, epistaxis, sore throat, earache, hearing loss, dental pain, Tinnitus, Vertigo, Sinus pain or snoring.  Cardio: Denies chest pain, palpitations, irregular heartbeat, syncope, dyspnea, diaphoresis, orthopnea, PND, claudication or edema Respiratory: denies cough, dyspnea, DOE, pleurisy, hoarseness, laryngitis or wheezing.  Gastrointestinal: Denies dysphagia, heartburn, reflux, water brash, pain, cramps, nausea, vomiting, bloating, diarrhea, constipation, hematemesis, melena, hematochezia, jaundice or hemorrhoids Genitourinary: Denies dysuria, frequency, urgency, nocturia, hesitancy, discharge, hematuria or flank pain Musculoskeletal: Denies arthralgia, myalgia, stiffness, Jt. Swelling, pain, limp or strain/sprain. Denies Falls. Skin: Denies puritis, rash, hives, warts, acne, eczema or change in skin lesion Neuro: No weakness, tremor, incoordination, spasms, paresthesia or pain Psychiatric: Denies confusion,  memory loss or sensory loss. Denies Depression. Endocrine: Denies change in weight, skin, hair change, nocturia, and paresthesia, diabetic polys, visual blurring or hyper / hypo glycemic episodes.  Heme/Lymph: No excessive bleeding, bruising or enlarged lymph nodes.  Physical Exam  BP 118/84   Pulse 72   Temp (!) 97.3 F (36.3 C)   Resp 16   Ht 5' 10.5" (1.791 m)   Wt 210 lb (95.3 kg)   BMI 29.71 kg/m   General Appearance: Well nourished and well groomed and in no apparent distress.  Eyes: PERRLA, EOMs, conjunctiva no swelling or erythema, normal fundi and vessels. Sinuses: No frontal/maxillary tenderness ENT/Mouth: EACs patent / TMs  nl. Nares clear without erythema, swelling, mucoid exudates. Oral hygiene is good. No erythema, swelling, or exudate. Tongue normal, non-obstructing. Tonsils not swollen or erythematous. Hearing normal.  Neck: Supple, thyroid not palpable. No bruits, nodes or JVD. Respiratory: Respiratory effort normal.  BS equal and clear bilateral without rales, rhonci, wheezing or stridor. Cardio: Heart sounds are normal with regular rate and rhythm  and no murmurs, rubs or gallops. Peripheral pulses are normal and equal bilaterally without edema. No aortic or femoral bruits. Chest: symmetric with normal excursions and percussion.  Abdomen: Soft, with Nl bowel sounds. Nontender, no guarding, rebound, hernias, masses, or organomegaly.  Lymphatics: Non tender without lymphadenopathy.  Genitourinary: No hernias.Testes nl. DRE - prostate nl for age - smooth & firm w/o nodules. Musculoskeletal: Full ROM all peripheral extremities, joint stability, 5/5 strength, and normal gait. Skin: Warm and dry without rashes, lesions, cyanosis, clubbing or  ecchymosis.  Neuro: Cranial nerves intact, reflexes equal bilaterally. Normal muscle tone, no cerebellar symptoms. Sensation intact.  Pysch: Alert and oriented X 3 with normal affect, insight and judgment appropriate.   Assessment  and Plan  1. Annual Preventative/Screening Exam   2. Labile hypertension  - EKG 12-Lead - US, RETROPERITNL ABD,  LTD - Urinalysis, Routine w reflex microscopic - Microalbumin / creatinine urine ratio - CBC with Differential/Platelet - BASIC METABOLIC PANEL WITH GFR - Magnesium - TSH  3. Hyperlipidemia, mixed  - EKG 12-Lead - US, RETROPERITNL ABD,  LTD - Hepatic function panel - Lipid panel - TSH  4. Abnormal glucose  - EKG 12-Lead - US, RETROPERITNL ABD,  LTD - Hemoglobin A1c - Insulin, random  5. Vitamin D deficiency  - VITAMIN D 25 Hydroxyl  6. Prediabetes  - EKG 12-Lead - US, RETROPERITNL ABD,  LTD - Hemoglobin A1c - Insulin, random  7. GERD  - CBC with Differential/Platelet  8. Testosterone deficiency  - Testosterone  9. Screening for colorectal cancer  - POC Hemoccult Bld/Stl   10. Screening for ischemic heart disease  - EKG 12-Lead  11. FHx: heart disease  - EKG 12-Lead - US, RETROPERITNL ABD,  LTD  12. Former smoker  - EKG 12-Lead - US, RETROPERITNL ABD,  LTD  13. Screening for AAA (aortic abdominal aneurysm)  - US, RETROPERITNL ABD,  LTD  14. Screening examination for pulmonary tuberculosis  - PPD  15. Prostate cancer screening  - PSA  16. Need for prophylactic vaccination against Streptococcus pneumoniae (pneumococcus)  - Pneumococcal polysaccharide vaccine 23-valent greater than or equal to 2yo subcutaneous/IM  17. Fatigue  - Iron,Total/Total Iron Binding Cap - Vitamin B12 - CBC with Differential/Platelet - TSH  18. Medication management  - Urinalysis, Routine w reflex microscopic - Microalbumin / creatinine urine ratio - Testosterone - CBC with Differential/Platelet - BASIC METABOLIC PANEL WITH GFR - Hepatic function panel - Magnesium - Lipid panel - TSH - Hemoglobin A1c - Insulin, random - VITAMIN D 25 Hydroxyl          Patient was counseled in prudent diet, weight control to achieve/maintain BMI less  than 25, BP monitoring, regular exercise and medications as discussed.  Discussed med effects and SE's. Routine screening labs and tests as requested with regular follow-up as recommended. Over 40 minutes of exam, counseling, chart review and high complex critical decision making was performed

## 2017-06-16 NOTE — Patient Instructions (Signed)

## 2017-06-17 ENCOUNTER — Encounter: Payer: Self-pay | Admitting: Internal Medicine

## 2017-06-17 ENCOUNTER — Ambulatory Visit: Payer: 59 | Admitting: Internal Medicine

## 2017-06-17 VITALS — BP 118/84 | HR 72 | Temp 97.3°F | Resp 16 | Ht 70.5 in | Wt 210.0 lb

## 2017-06-17 DIAGNOSIS — E782 Mixed hyperlipidemia: Secondary | ICD-10-CM

## 2017-06-17 DIAGNOSIS — Z1211 Encounter for screening for malignant neoplasm of colon: Secondary | ICD-10-CM

## 2017-06-17 DIAGNOSIS — R7309 Other abnormal glucose: Secondary | ICD-10-CM | POA: Diagnosis not present

## 2017-06-17 DIAGNOSIS — Z136 Encounter for screening for cardiovascular disorders: Secondary | ICD-10-CM

## 2017-06-17 DIAGNOSIS — Z125 Encounter for screening for malignant neoplasm of prostate: Secondary | ICD-10-CM | POA: Diagnosis not present

## 2017-06-17 DIAGNOSIS — I10 Essential (primary) hypertension: Secondary | ICD-10-CM | POA: Diagnosis not present

## 2017-06-17 DIAGNOSIS — Z87891 Personal history of nicotine dependence: Secondary | ICD-10-CM

## 2017-06-17 DIAGNOSIS — R0989 Other specified symptoms and signs involving the circulatory and respiratory systems: Secondary | ICD-10-CM

## 2017-06-17 DIAGNOSIS — E559 Vitamin D deficiency, unspecified: Secondary | ICD-10-CM

## 2017-06-17 DIAGNOSIS — Z23 Encounter for immunization: Secondary | ICD-10-CM

## 2017-06-17 DIAGNOSIS — Z0000 Encounter for general adult medical examination without abnormal findings: Secondary | ICD-10-CM | POA: Diagnosis not present

## 2017-06-17 DIAGNOSIS — R7303 Prediabetes: Secondary | ICD-10-CM | POA: Diagnosis not present

## 2017-06-17 DIAGNOSIS — R5383 Other fatigue: Secondary | ICD-10-CM

## 2017-06-17 DIAGNOSIS — E349 Endocrine disorder, unspecified: Secondary | ICD-10-CM

## 2017-06-17 DIAGNOSIS — Z1212 Encounter for screening for malignant neoplasm of rectum: Secondary | ICD-10-CM

## 2017-06-17 DIAGNOSIS — Z79899 Other long term (current) drug therapy: Secondary | ICD-10-CM

## 2017-06-17 DIAGNOSIS — K2100 Gastro-esophageal reflux disease with esophagitis, without bleeding: Secondary | ICD-10-CM

## 2017-06-17 DIAGNOSIS — Z111 Encounter for screening for respiratory tuberculosis: Secondary | ICD-10-CM | POA: Diagnosis not present

## 2017-06-17 DIAGNOSIS — Z0001 Encounter for general adult medical examination with abnormal findings: Secondary | ICD-10-CM

## 2017-06-17 DIAGNOSIS — Z8249 Family history of ischemic heart disease and other diseases of the circulatory system: Secondary | ICD-10-CM

## 2017-06-18 LAB — CBC WITH DIFFERENTIAL/PLATELET
BASOS ABS: 71 {cells}/uL (ref 0–200)
Basophils Relative: 1 %
Eosinophils Absolute: 227 cells/uL (ref 15–500)
Eosinophils Relative: 3.2 %
HEMATOCRIT: 45.1 % (ref 38.5–50.0)
Hemoglobin: 15.3 g/dL (ref 13.2–17.1)
Lymphs Abs: 2705 cells/uL (ref 850–3900)
MCH: 30.4 pg (ref 27.0–33.0)
MCHC: 33.9 g/dL (ref 32.0–36.0)
MCV: 89.5 fL (ref 80.0–100.0)
MPV: 11.9 fL (ref 7.5–12.5)
Monocytes Relative: 10 %
NEUTROS PCT: 47.7 %
Neutro Abs: 3387 cells/uL (ref 1500–7800)
Platelets: 172 10*3/uL (ref 140–400)
RBC: 5.04 10*6/uL (ref 4.20–5.80)
RDW: 13 % (ref 11.0–15.0)
Total Lymphocyte: 38.1 %
WBC: 7.1 10*3/uL (ref 3.8–10.8)
WBCMIX: 710 {cells}/uL (ref 200–950)

## 2017-06-18 LAB — BASIC METABOLIC PANEL WITH GFR
BUN: 21 mg/dL (ref 7–25)
CO2: 30 mmol/L (ref 20–32)
CREATININE: 0.97 mg/dL (ref 0.70–1.33)
Calcium: 9.4 mg/dL (ref 8.6–10.3)
Chloride: 105 mmol/L (ref 98–110)
GFR, EST AFRICAN AMERICAN: 99 mL/min/{1.73_m2} (ref >=60)
GFR, EST NON AFRICAN AMERICAN: 86 mL/min/{1.73_m2} (ref >=60)
Glucose, Bld: 102 mg/dL — ABNORMAL HIGH (ref 65–99)
Potassium: 4.5 mmol/L (ref 3.5–5.3)
SODIUM: 141 mmol/L (ref 135–146)

## 2017-06-18 LAB — HEPATIC FUNCTION PANEL
AG RATIO: 1.9 (calc) (ref 1.0–2.5)
ALKALINE PHOSPHATASE (APISO): 138 U/L — AB (ref 40–115)
ALT: 17 U/L (ref 9–46)
AST: 19 U/L (ref 10–35)
Albumin: 4.4 g/dL (ref 3.6–5.1)
BILIRUBIN DIRECT: 0.1 mg/dL (ref 0.0–0.2)
BILIRUBIN TOTAL: 0.7 mg/dL (ref 0.2–1.2)
Globulin: 2.3 g/dL (calc) (ref 1.9–3.7)
Indirect Bilirubin: 0.6 mg/dL (calc) (ref 0.2–1.2)
Total Protein: 6.7 g/dL (ref 6.1–8.1)

## 2017-06-18 LAB — URINALYSIS, ROUTINE W REFLEX MICROSCOPIC
BILIRUBIN URINE: NEGATIVE
Glucose, UA: NEGATIVE
HGB URINE DIPSTICK: NEGATIVE
Ketones, ur: NEGATIVE
Leukocytes, UA: NEGATIVE
Nitrite: NEGATIVE
PROTEIN: NEGATIVE
Specific Gravity, Urine: 1.026 (ref 1.001–1.03)
pH: <=5 (ref 5.0–8.0)

## 2017-06-18 LAB — MAGNESIUM: Magnesium: 2.4 mg/dL (ref 1.5–2.5)

## 2017-06-18 LAB — LIPID PANEL
CHOL/HDL RATIO: 4.1 (calc) (ref <5.0)
Cholesterol: 221 mg/dL — ABNORMAL HIGH (ref <200)
HDL: 54 mg/dL (ref >40)
LDL Cholesterol (Calc): 136 mg/dL (calc) — ABNORMAL HIGH
NON-HDL CHOLESTEROL (CALC): 167 mg/dL — AB (ref <130)
TRIGLYCERIDES: 173 mg/dL — AB (ref <150)

## 2017-06-18 LAB — HEMOGLOBIN A1C
Hgb A1c MFr Bld: 5.2 % of total Hgb (ref <5.7)
MEAN PLASMA GLUCOSE: 103 (calc)
eAG (mmol/L): 5.7 (calc)

## 2017-06-18 LAB — VITAMIN D 25 HYDROXY (VIT D DEFICIENCY, FRACTURES): Vit D, 25-Hydroxy: 60 ng/mL (ref 30–100)

## 2017-06-18 LAB — MICROALBUMIN / CREATININE URINE RATIO
CREATININE, URINE: 196 mg/dL (ref 20–320)
Microalb Creat Ratio: 3 mcg/mg creat (ref <30)
Microalb, Ur: 0.5 mg/dL

## 2017-06-18 LAB — INSULIN, RANDOM: INSULIN: 7.9 u[IU]/mL (ref 2.0–19.6)

## 2017-06-18 LAB — TSH: TSH: 2.44 m[IU]/L (ref 0.40–4.50)

## 2017-06-18 LAB — PSA: PSA: 0.5 ng/mL (ref <=4.0)

## 2017-06-18 LAB — IRON, TOTAL/TOTAL IRON BINDING CAP
%SAT: 38 % (calc) (ref 15–60)
IRON: 119 ug/dL (ref 50–180)
TIBC: 314 mcg/dL (calc) (ref 250–425)

## 2017-06-18 LAB — TESTOSTERONE: Testosterone: 324 ng/dL (ref 250–827)

## 2017-06-18 LAB — VITAMIN B12: VITAMIN B 12: 440 pg/mL (ref 200–1100)

## 2017-07-27 ENCOUNTER — Other Ambulatory Visit: Payer: Self-pay | Admitting: Internal Medicine

## 2017-07-27 DIAGNOSIS — E782 Mixed hyperlipidemia: Secondary | ICD-10-CM

## 2017-08-28 DIAGNOSIS — M25562 Pain in left knee: Secondary | ICD-10-CM | POA: Diagnosis not present

## 2017-08-30 ENCOUNTER — Ambulatory Visit: Payer: Self-pay | Admitting: Internal Medicine

## 2017-09-02 DIAGNOSIS — M84362A Stress fracture, left tibia, initial encounter for fracture: Secondary | ICD-10-CM | POA: Diagnosis not present

## 2017-09-02 DIAGNOSIS — M79662 Pain in left lower leg: Secondary | ICD-10-CM | POA: Diagnosis not present

## 2017-09-09 DIAGNOSIS — M899 Disorder of bone, unspecified: Secondary | ICD-10-CM | POA: Diagnosis not present

## 2017-09-09 DIAGNOSIS — R0989 Other specified symptoms and signs involving the circulatory and respiratory systems: Secondary | ICD-10-CM | POA: Diagnosis not present

## 2017-09-11 DIAGNOSIS — M899 Disorder of bone, unspecified: Secondary | ICD-10-CM | POA: Diagnosis not present

## 2017-09-27 DIAGNOSIS — M25462 Effusion, left knee: Secondary | ICD-10-CM | POA: Diagnosis not present

## 2017-09-27 DIAGNOSIS — M899 Disorder of bone, unspecified: Secondary | ICD-10-CM | POA: Diagnosis not present

## 2017-09-27 DIAGNOSIS — M898X6 Other specified disorders of bone, lower leg: Secondary | ICD-10-CM | POA: Diagnosis not present

## 2017-09-27 DIAGNOSIS — R2242 Localized swelling, mass and lump, left lower limb: Secondary | ICD-10-CM | POA: Diagnosis not present

## 2017-10-08 DIAGNOSIS — M899 Disorder of bone, unspecified: Secondary | ICD-10-CM | POA: Insufficient documentation

## 2017-10-16 DIAGNOSIS — M899 Disorder of bone, unspecified: Secondary | ICD-10-CM | POA: Diagnosis not present

## 2017-10-22 DIAGNOSIS — I10 Essential (primary) hypertension: Secondary | ICD-10-CM | POA: Diagnosis not present

## 2017-10-22 DIAGNOSIS — M899 Disorder of bone, unspecified: Secondary | ICD-10-CM | POA: Diagnosis not present

## 2017-10-22 DIAGNOSIS — M889 Osteitis deformans of unspecified bone: Secondary | ICD-10-CM | POA: Diagnosis not present

## 2017-10-22 DIAGNOSIS — M88862 Osteitis deformans of left lower leg: Secondary | ICD-10-CM | POA: Diagnosis not present

## 2017-10-22 DIAGNOSIS — M89362 Hypertrophy of bone, left tibia: Secondary | ICD-10-CM | POA: Diagnosis not present

## 2017-10-22 DIAGNOSIS — K219 Gastro-esophageal reflux disease without esophagitis: Secondary | ICD-10-CM | POA: Diagnosis not present

## 2017-11-13 DIAGNOSIS — M79662 Pain in left lower leg: Secondary | ICD-10-CM | POA: Diagnosis not present

## 2017-11-13 DIAGNOSIS — M88862 Osteitis deformans of left lower leg: Secondary | ICD-10-CM | POA: Diagnosis not present

## 2017-11-13 DIAGNOSIS — M899 Disorder of bone, unspecified: Secondary | ICD-10-CM | POA: Diagnosis not present

## 2017-12-18 DIAGNOSIS — M899 Disorder of bone, unspecified: Secondary | ICD-10-CM | POA: Diagnosis not present

## 2017-12-18 DIAGNOSIS — M898X6 Other specified disorders of bone, lower leg: Secondary | ICD-10-CM | POA: Diagnosis not present

## 2017-12-24 DIAGNOSIS — M25551 Pain in right hip: Secondary | ICD-10-CM | POA: Diagnosis not present

## 2017-12-24 DIAGNOSIS — M5440 Lumbago with sciatica, unspecified side: Secondary | ICD-10-CM | POA: Diagnosis not present

## 2017-12-24 DIAGNOSIS — E119 Type 2 diabetes mellitus without complications: Secondary | ICD-10-CM | POA: Diagnosis not present

## 2018-01-03 DIAGNOSIS — Z23 Encounter for immunization: Secondary | ICD-10-CM | POA: Diagnosis not present

## 2018-04-18 DIAGNOSIS — M899 Disorder of bone, unspecified: Secondary | ICD-10-CM | POA: Diagnosis not present

## 2018-06-27 ENCOUNTER — Other Ambulatory Visit: Payer: Self-pay | Admitting: Internal Medicine

## 2018-06-27 DIAGNOSIS — E782 Mixed hyperlipidemia: Secondary | ICD-10-CM

## 2018-07-09 ENCOUNTER — Encounter: Payer: Self-pay | Admitting: Internal Medicine

## 2018-09-25 ENCOUNTER — Other Ambulatory Visit: Payer: Self-pay | Admitting: Internal Medicine

## 2018-09-25 DIAGNOSIS — E782 Mixed hyperlipidemia: Secondary | ICD-10-CM

## 2018-11-12 ENCOUNTER — Ambulatory Visit: Payer: 59 | Admitting: Internal Medicine

## 2018-11-12 ENCOUNTER — Other Ambulatory Visit: Payer: Self-pay

## 2018-11-12 ENCOUNTER — Encounter: Payer: Self-pay | Admitting: Internal Medicine

## 2018-11-12 VITALS — BP 118/72 | HR 88 | Temp 97.0°F | Resp 16 | Ht 70.5 in | Wt 214.2 lb

## 2018-11-12 DIAGNOSIS — Z136 Encounter for screening for cardiovascular disorders: Secondary | ICD-10-CM | POA: Diagnosis not present

## 2018-11-12 DIAGNOSIS — Z111 Encounter for screening for respiratory tuberculosis: Secondary | ICD-10-CM | POA: Diagnosis not present

## 2018-11-12 DIAGNOSIS — Z8249 Family history of ischemic heart disease and other diseases of the circulatory system: Secondary | ICD-10-CM

## 2018-11-12 DIAGNOSIS — Z1211 Encounter for screening for malignant neoplasm of colon: Secondary | ICD-10-CM

## 2018-11-12 DIAGNOSIS — Z87891 Personal history of nicotine dependence: Secondary | ICD-10-CM | POA: Diagnosis not present

## 2018-11-12 DIAGNOSIS — I10 Essential (primary) hypertension: Secondary | ICD-10-CM | POA: Diagnosis not present

## 2018-11-12 DIAGNOSIS — N401 Enlarged prostate with lower urinary tract symptoms: Secondary | ICD-10-CM

## 2018-11-12 DIAGNOSIS — E782 Mixed hyperlipidemia: Secondary | ICD-10-CM

## 2018-11-12 DIAGNOSIS — R7303 Prediabetes: Secondary | ICD-10-CM

## 2018-11-12 DIAGNOSIS — Z0000 Encounter for general adult medical examination without abnormal findings: Secondary | ICD-10-CM | POA: Diagnosis not present

## 2018-11-12 DIAGNOSIS — Z79899 Other long term (current) drug therapy: Secondary | ICD-10-CM

## 2018-11-12 DIAGNOSIS — E349 Endocrine disorder, unspecified: Secondary | ICD-10-CM

## 2018-11-12 DIAGNOSIS — R5383 Other fatigue: Secondary | ICD-10-CM

## 2018-11-12 DIAGNOSIS — Z23 Encounter for immunization: Secondary | ICD-10-CM

## 2018-11-12 DIAGNOSIS — R0989 Other specified symptoms and signs involving the circulatory and respiratory systems: Secondary | ICD-10-CM

## 2018-11-12 DIAGNOSIS — R7309 Other abnormal glucose: Secondary | ICD-10-CM

## 2018-11-12 DIAGNOSIS — Z0001 Encounter for general adult medical examination with abnormal findings: Secondary | ICD-10-CM

## 2018-11-12 DIAGNOSIS — Z125 Encounter for screening for malignant neoplasm of prostate: Secondary | ICD-10-CM

## 2018-11-12 DIAGNOSIS — K2100 Gastro-esophageal reflux disease with esophagitis, without bleeding: Secondary | ICD-10-CM

## 2018-11-12 DIAGNOSIS — N138 Other obstructive and reflux uropathy: Secondary | ICD-10-CM

## 2018-11-12 DIAGNOSIS — E559 Vitamin D deficiency, unspecified: Secondary | ICD-10-CM

## 2018-11-12 NOTE — Progress Notes (Signed)
Annual  Screening/Preventative Visit  & Comprehensive Evaluation & Examination     This very nice 58 y.o.  MWM presents for a Screening /Preventative Visit & comprehensive evaluation and management of multiple medical co-morbidities.  Patient has been followed for labile HTN, HLD, Prediabetes and Vitamin D Deficiency. He also has a remote hx/o OSA & after a 35# weight loss was able to stop CPAP.  He has GERD controlled on current meds.      Patient has hx/o labile HTN followed expectantly since 2013. Patient's BP has been controlled at home.  Today's BP is at goal - 118/72. He had a negative Cardiolite in 2013.  Patient denies any cardiac symptoms as chest pain, palpitations, shortness of breath, dizziness or ankle swelling.     Patient's hyperlipidemia is not controlled with diet and Rosuvastatin QOD. Patient denies myalgias or other medication SE's. Last lipids were not at goal: Lab Results  Component Value Date   CHOL 221 (H) 06/17/2017   HDL 54 06/17/2017   LDLCALC 136 (H) 06/17/2017   TRIG 173 (H) 06/17/2017   CHOLHDL 4.1 06/17/2017      Patient has hx/o prediabetes (A1c 5.7% / 2009)  and patient denies reactive hypoglycemic symptoms, visual blurring, diabetic polys or paresthesias. Last A1c was Normal & at goal: Lab Results  Component Value Date   HGBA1C 5.2 06/17/2017       Finally, patient has history of Vitamin D Deficiency ("39" / 2016) and last vitamin D was at goal: Lab Results  Component Value Date   VD25OH 60 06/17/2017   Current Outpatient Medications on File Prior to Visit  Medication Sig  . aspirin 81 MG EC tablet Take 81 mg by mouth daily.    . B Complex Vitamins (VITAMIN B COMPLEX PO) Take by mouth daily.  . Calcium Carbonate-Vit D-Min (CALCIUM 1200 PO) Take by mouth daily.  . Cholecalciferol (VITAMIN D PO) Take 5,000 Units by mouth daily.  . ferrous fumarate (HEMOCYTE - 106 MG FE) 325 (106 FE) MG TABS tablet Take 1 tablet by mouth.  Marland Kitchen. glucosamine-chondroitin  500-400 MG tablet Take 1 tablet by mouth 2 (two) times daily.  . meloxicam (MOBIC) 15 MG tablet Take 1 tablet (15 mg total) by mouth daily.  . Multiple Vitamin (MULTIVITAMIN) tablet Take 1 tablet by mouth daily.  . Omega-3 Fatty Acids (OMEGA 3 PO) Take by mouth daily.  Marland Kitchen. OVER THE COUNTER MEDICATION Herbal Life Prostrate Health  . rosuvastatin (CRESTOR) 40 MG tablet TAKE 1/2 TO 1 (ONE-HALF TO ONE) TABLET BY MOUTH ONCE DAILY FOR CHOLESTEROL   No current facility-administered medications on file prior to visit.    Not on File Past Medical History:  Diagnosis Date  . GERD (gastroesophageal reflux disease)   . History of ETT    cardiolite > 10 years ago was normal per patient's report  . Hyperlipidemia   . Low testosterone   . OSA (obstructive sleep apnea)   . Sciatica    Health Maintenance  Topic Date Due  . FOOT EXAM  10/28/2014  . HEMOGLOBIN A1C  12/17/2017  . TETANUS/TDAP  02/09/2018  . OPHTHALMOLOGY EXAM  04/01/2018  . URINE MICROALBUMIN  06/18/2018  . INFLUENZA VACCINE  10/11/2018  . COLONOSCOPY  06/28/2019  . PNEUMOCOCCAL POLYSACCHARIDE VACCINE AGE 15-64 HIGH RISK  Completed  . Hepatitis C Screening  Completed  . HIV Screening  Completed   Immunization History  Administered Date(s) Administered  . Hepatitis B, ped/adol 09/02/2013, 10/01/2013, 01/20/2014  .  Influenza-Unspecified 01/03/2018  . PPD Test 10/27/2013, 12/10/2014, 05/08/2016, 06/17/2017  . Pneumococcal Polysaccharide-23 01/12/2008, 06/17/2017  . Tdap 02/10/2008   Last Colon -  Past Surgical History:  Procedure Laterality Date  . APPENDECTOMY     Family History  Problem Relation Age of Onset  . Heart attack Father 5  . Heart attack Brother 93  . Heart attack Sister 27   Social History   Socioeconomic History  . Marital status: Married    Spouse name: Clydie Braun  . Number of children: 2 daughters & 3 GrSons  Occupational History  . Occupation: Research officer, trade union for [employer]  Tobacco Use  .  Smoking status: Former Smoker    Quit date: 03/12/1986    Years since quitting: 32.6  . Smokeless tobacco: Never Used  Substance and Sexual Activity  . Alcohol use: Yes    Alcohol/week: 0.0 standard drinks    Comment: very rarely  . Drug use: Not on file  . Sexual activity: Not on file  Social History Narrative   Lives in [place]    ROS Constitutional: Denies fever, chills, weight loss/gain, headaches, insomnia,  night sweats or change in appetite. Does c/o fatigue. Eyes: Denies redness, blurred vision, diplopia, discharge, itchy or watery eyes.  ENT: Denies discharge, congestion, post nasal drip, epistaxis, sore throat, earache, hearing loss, dental pain, Tinnitus, Vertigo, Sinus pain or snoring.  Cardio: Denies chest pain, palpitations, irregular heartbeat, syncope, dyspnea, diaphoresis, orthopnea, PND, claudication or edema Respiratory: denies cough, dyspnea, DOE, pleurisy, hoarseness, laryngitis or wheezing.  Gastrointestinal: Denies dysphagia, heartburn, reflux, water brash, pain, cramps, nausea, vomiting, bloating, diarrhea, constipation, hematemesis, melena, hematochezia, jaundice or hemorrhoids Genitourinary: Denies dysuria, frequency, urgency, nocturia, hesitancy, discharge, hematuria or flank pain Musculoskeletal: Denies arthralgia, myalgia, stiffness, Jt. Swelling, pain, limp or strain/sprain. Denies Falls. Skin: Denies puritis, rash, hives, warts, acne, eczema or change in skin lesion Neuro: No weakness, tremor, incoordination, spasms, paresthesia or pain Psychiatric: Denies confusion, memory loss or sensory loss. Denies Depression. Endocrine: Denies change in weight, skin, hair change, nocturia, and paresthesia, diabetic polys, visual blurring or hyper / hypo glycemic episodes.  Heme/Lymph: No excessive bleeding, bruising or enlarged lymph nodes.  Physical Exam  BP 118/72   Pulse 88   Temp (!) 97 F (36.1 C)   Resp 16   Ht 5' 10.5" (1.791 m)   Wt 214 lb 3.2 oz  (97.2 kg)   BMI 30.30 kg/m   General Appearance: Well nourished and well groomed and in no apparent distress.  Eyes: PERRLA, EOMs, conjunctiva no swelling or erythema, normal fundi and vessels. Sinuses: No frontal/maxillary tenderness ENT/Mouth: EACs patent / TMs  nl. Nares clear without erythema, swelling, mucoid exudates. Oral hygiene is good. No erythema, swelling, or exudate. Tongue normal, non-obstructing. Tonsils not swollen or erythematous. Hearing normal.  Neck: Supple, thyroid not palpable. No bruits, nodes or JVD. Respiratory: Respiratory effort normal.  BS equal and clear bilateral without rales, rhonci, wheezing or stridor. Cardio: Heart sounds are normal with regular rate and rhythm and no murmurs, rubs or gallops. Peripheral pulses are normal and equal bilaterally without edema. No aortic or femoral bruits. Chest: symmetric with normal excursions and percussion.  Abdomen: Soft, with Nl bowel sounds. Nontender, no guarding, rebound, hernias, masses, or organomegaly.  Lymphatics: Non tender without lymphadenopathy.  Musculoskeletal: Full ROM all peripheral extremities, joint stability, 5/5 strength, and normal gait. Skin: Warm and dry without rashes, lesions, cyanosis, clubbing or  ecchymosis.  Neuro: Cranial nerves intact, reflexes equal bilaterally.  Normal muscle tone, no cerebellar symptoms. Sensation intact.  Pysch: Alert and oriented X 3 with normal affect, insight and judgment appropriate.   Assessment and Plan  1. Annual Preventative/Screening Exam   2. Labile hypertension  - EKG 12-Lead - [place], RETROPERITNL ABD,  LTD - Urinalysis, Routine w reflex microscopic - Microalbumin / creatinine urine ratio - CBC with Differential/Platelet - COMPLETE METABOLIC PANEL WITH GFR - Magnesium - TSH  3. Hyperlipidemia, mixed  - EKG 12-Lead - [place], RETROPERITNL ABD,  LTD - Lipid panel - TSH  4. Abnormal glucose  - EKG 12-Lead - [place], RETROPERITNL ABD,  LTD - Hemoglobin A1c  - Insulin, random  5. Vitamin D deficiency  - VITAMIN D 25 Hydroxyl  6. GERD  - CBC with Differential/Platelet  7. Testosterone deficiency  - Testosterone  8. BPH with obstruction/lower urinary tract symptoms  - PSA  9. Prostate cancer screening  - PSA  10. Screening for colorectal cancer  - POC Hemoccult Bld/Stl  11. Screening for ischemic heart disease  - EKG 12-Lead  12. FHx: heart disease  - EKG 12-Lead - [place], RETROPERITNL ABD,  LTD  13. Former smoker  - EKG 12-Lead - [place], RETROPERITNL ABD,  LTD  14. Screening for AAA (aortic abdominal aneurysm)  - [place], RETROPERITNL ABD,  LTD  15. Screening examination for pulmonary tuberculosis  - TB Skin Test  16. Fatigue  - Iron,Total/Total Iron Binding Cap - Vitamin B12 - Testosterone - CBC with Differential/Platelet  17. Medication management  - Urinalysis, Routine w reflex microscopic - CBC with Differential/Platelet - COMPLETE METABOLIC PANEL WITH GFR - Magnesium - Lipid panel - TSH - Hemoglobin A1c - Insulin, random - VITAMIN D 25 Hydroxyl  18. Need for prophylactic vaccination with combined diphtheria-tetanus-pertussis (DTP) vaccine  - Tdap vaccine greater than or equal to 7yo IM          Patient was counseled in prudent diet, weight control to achieve/maintain BMI less than 25, BP monitoring, regular exercise and medications as discussed.  Discussed med effects and SE's. Routine screening labs and tests as requested with regular follow-up as recommended. Over 40 minutes of exam, counseling, chart review and high complex critical decision making was performed   Kirtland Bouchard, MD

## 2018-11-12 NOTE — Patient Instructions (Signed)

## 2018-11-13 LAB — CBC WITH DIFFERENTIAL/PLATELET
Absolute Monocytes: 704 cells/uL (ref 200–950)
Basophils Absolute: 60 cells/uL (ref 0–200)
Basophils Relative: 0.9 %
Eosinophils Absolute: 181 cells/uL (ref 15–500)
Eosinophils Relative: 2.7 %
HCT: 44.6 % (ref 38.5–50.0)
Hemoglobin: 15.1 g/dL (ref 13.2–17.1)
Lymphs Abs: 2245 cells/uL (ref 850–3900)
MCH: 31 pg (ref 27.0–33.0)
MCHC: 33.9 g/dL (ref 32.0–36.0)
MCV: 91.6 fL (ref 80.0–100.0)
MPV: 12 fL (ref 7.5–12.5)
Monocytes Relative: 10.5 %
Neutro Abs: 3511 cells/uL (ref 1500–7800)
Neutrophils Relative %: 52.4 %
Platelets: 141 10*3/uL (ref 140–400)
RBC: 4.87 10*6/uL (ref 4.20–5.80)
RDW: 12.6 % (ref 11.0–15.0)
Total Lymphocyte: 33.5 %
WBC: 6.7 10*3/uL (ref 3.8–10.8)

## 2018-11-13 LAB — URINALYSIS, ROUTINE W REFLEX MICROSCOPIC
Bilirubin Urine: NEGATIVE
Glucose, UA: NEGATIVE
Hgb urine dipstick: NEGATIVE
Ketones, ur: NEGATIVE
Leukocytes,Ua: NEGATIVE
Nitrite: NEGATIVE
Protein, ur: NEGATIVE
Specific Gravity, Urine: 1.021 (ref 1.001–1.03)
pH: 7 (ref 5.0–8.0)

## 2018-11-13 LAB — HEMOGLOBIN A1C
Hgb A1c MFr Bld: 5.4 % of total Hgb (ref <5.7)
Mean Plasma Glucose: 108 (calc)
eAG (mmol/L): 6 (calc)

## 2018-11-13 LAB — COMPLETE METABOLIC PANEL WITH GFR
AG Ratio: 1.8 (calc) (ref 1.0–2.5)
ALT: 21 U/L (ref 9–46)
AST: 24 U/L (ref 10–35)
Albumin: 4.5 g/dL (ref 3.6–5.1)
Alkaline phosphatase (APISO): 110 U/L (ref 35–144)
BUN: 14 mg/dL (ref 7–25)
CO2: 28 mmol/L (ref 20–32)
Calcium: 9.7 mg/dL (ref 8.6–10.3)
Chloride: 105 mmol/L (ref 98–110)
Creat: 0.88 mg/dL (ref 0.70–1.25)
GFR, Est African American: 108 mL/min/{1.73_m2} (ref >=60)
GFR, Est Non African American: 93 mL/min/{1.73_m2} (ref >=60)
Globulin: 2.5 g/dL (calc) (ref 1.9–3.7)
Glucose, Bld: 106 mg/dL — ABNORMAL HIGH (ref 65–99)
Potassium: 4.2 mmol/L (ref 3.5–5.3)
Sodium: 141 mmol/L (ref 135–146)
Total Bilirubin: 0.9 mg/dL (ref 0.2–1.2)
Total Protein: 7 g/dL (ref 6.1–8.1)

## 2018-11-13 LAB — LIPID PANEL
Cholesterol: 147 mg/dL (ref <200)
HDL: 60 mg/dL (ref >=40)
LDL Cholesterol (Calc): 68 mg/dL (calc)
Non-HDL Cholesterol (Calc): 87 mg/dL (calc) (ref <130)
Total CHOL/HDL Ratio: 2.5 (calc) (ref <5.0)
Triglycerides: 111 mg/dL (ref <150)

## 2018-11-13 LAB — IRON,?TOTAL/TOTAL IRON BINDING CAP
%SAT: 25 % (calc) (ref 20–48)
TIBC: 297 mcg/dL (calc) (ref 250–425)

## 2018-11-13 LAB — PSA: PSA: 0.5 ng/mL (ref <=4.0)

## 2018-11-13 LAB — VITAMIN D 25 HYDROXY (VIT D DEFICIENCY, FRACTURES): Vit D, 25-Hydroxy: 46 ng/mL (ref 30–100)

## 2018-11-13 LAB — MAGNESIUM: Magnesium: 2.2 mg/dL (ref 1.5–2.5)

## 2018-11-13 LAB — MICROALBUMIN / CREATININE URINE RATIO
Creatinine, Urine: 180 mg/dL (ref 20–320)
Microalb Creat Ratio: 2 mcg/mg creat (ref <30)
Microalb, Ur: 0.4 mg/dL

## 2018-11-13 LAB — TSH: TSH: 1.82 mIU/L (ref 0.40–4.50)

## 2018-11-13 LAB — VITAMIN B12: Vitamin B-12: 455 pg/mL (ref 200–1100)

## 2018-11-13 LAB — TESTOSTERONE: Testosterone: 400 ng/dL (ref 250–827)

## 2018-11-13 LAB — IRON, TOTAL/TOTAL IRON BINDING CAP: Iron: 74 ug/dL (ref 50–180)

## 2018-11-13 LAB — INSULIN, RANDOM: Insulin: 11.1 u[IU]/mL

## 2018-11-26 ENCOUNTER — Other Ambulatory Visit: Payer: Self-pay

## 2018-11-26 ENCOUNTER — Ambulatory Visit: Payer: 59 | Admitting: Internal Medicine

## 2018-11-26 ENCOUNTER — Encounter: Payer: Self-pay | Admitting: Internal Medicine

## 2018-11-26 VITALS — BP 116/80 | HR 72 | Temp 97.3°F | Resp 16 | Ht 70.5 in | Wt 218.0 lb

## 2018-11-26 DIAGNOSIS — R0989 Other specified symptoms and signs involving the circulatory and respiratory systems: Secondary | ICD-10-CM

## 2018-11-26 NOTE — Progress Notes (Signed)
    Subjective:    Patient ID: Erik [facility]as, male    DOB: September 14, 1960, 58 y.o.   MRN: [id number]  HPI       Patient is a very nice 58 yo MWM with labile HTN, HLD, Prediabetes and Vitamin D Deficiency. He has had concerns re: whether his BP 's may have been elevated. Recent labs did show a low Vit D 46 & he has increased his intake. He also had been given a Rx of  Cialis and had questions related to the use of and expectations when taken.   Medication Sig  . aspirin 81 MG EC tablet Take 81 mg by mouth daily.    . B Complex Vitamins (VITAMIN B COMPLEX PO) Take by mouth daily.  . Calcium Carbonate-Vit D-Min (CALCIUM 1200 PO) Take by mouth daily.  . Cholecalciferol (VITAMIN D PO) Take 5,000 Units by mouth daily.  . ferrous fumarate (HEMOCYTE - 106 MG FE) 325 (106 FE) MG TABS tablet Take 1 tablet by mouth.  Marland Kitchen glucosamine-chondroitin 500-400 MG tablet Take 1 tablet by mouth 2 (two) times daily.  . meloxicam (MOBIC) 15 MG tablet Take 1 tablet (15 mg total) by mouth daily.  . Multiple Vitamin (MULTIVITAMIN) tablet Take 1 tablet by mouth daily.  . Omega-3 Fatty Acids (OMEGA 3 PO) Take by mouth daily.  Marland Kitchen [place]  . rosuvastatin (CRESTOR) 40 MG tablet TAKE 1/2 TO 1 (ONE-HALF TO ONE) TABLET BY MOUTH ONCE DAILY FOR CHOLESTEROL   No Known Allergies   Past Medical History:  Diagnosis Date  . GERD (gastroesophageal reflux disease)   . History of ETT    cardiolite > 10 years ago was normal per patient's report  . Hyperlipidemia   . Low testosterone   . OSA (obstructive sleep apnea)   . Sciatica    Past Surgical History:  Procedure Laterality Date  . APPENDECTOMY     Review of Systems   10 point systems review negative except as above.    Objective:   Physical Exam  BP 116/80   Pulse 72   Temp (!) 97.3 F (36.3 C)   Resp 16   Ht 5' 10.5" (1.791 m)   Wt 218 lb (98.9 kg)   BMI 30.84 kg/m   HEENT - WNL. Neck - supple.  Chest - Clear equal  BS. Cor - Nl HS. RRR w/o sig MGR. PP 1(+). No edema. MS- FROM w/o deformities.  Gait Nl. Neuro -  Nl w/o focal abnormalities.    Assessment & Plan:   1. Labile hypertension  - Discussed meds & side-effects. Between 15-16 minutes of counseling, chart review, and critical decision making was performed

## 2018-12-11 ENCOUNTER — Other Ambulatory Visit: Payer: Self-pay | Admitting: Physician Assistant

## 2018-12-11 DIAGNOSIS — E782 Mixed hyperlipidemia: Secondary | ICD-10-CM

## 2019-05-22 ENCOUNTER — Ambulatory Visit (INDEPENDENT_AMBULATORY_CARE_PROVIDER_SITE_OTHER): Payer: 59 | Admitting: Internal Medicine

## 2019-05-22 ENCOUNTER — Encounter: Payer: Self-pay | Admitting: Internal Medicine

## 2019-05-22 DIAGNOSIS — Z5329 Procedure and treatment not carried out because of patient's decision for other reasons: Secondary | ICD-10-CM | POA: Diagnosis not present

## 2019-05-22 NOTE — Progress Notes (Addendum)
NO SHOW

## 2019-05-22 NOTE — Patient Instructions (Signed)

## 2019-06-01 ENCOUNTER — Encounter: Payer: Self-pay | Admitting: Internal Medicine

## 2019-06-01 NOTE — Patient Instructions (Signed)

## 2019-06-01 NOTE — Progress Notes (Signed)
History of Present Illness:       This very nice 59 y.o.  MWM presents for 6 month follow up with HTN, HLD, Pre-Diabetes and Vitamin D Deficiency. Patient has prior dx/o OSA seemingly cured after a 35# weight loss.  Patient also has GERD controlled on meds.       Patient is followed expectantly for elevated BP since 2013  & BP has been controlled and today's BP is at goal - 122/82.   Cardiolite in 2013 was negative.  Patient has had no complaints of any cardiac type chest pain, palpitations, dyspnea / orthopnea / PND, dizziness, claudication, or dependent edema.      Hyperlipidemia is controlled with diet & meds. Patient denies myalgias or other med SE's. Last Lipids were at goal:  Lab Results  Component Value Date   CHOL 147 11/12/2018   HDL 60 11/12/2018   LDLCALC 68 11/12/2018   TRIG 111 11/12/2018   CHOLHDL 2.5 11/12/2018    Also, the patient has history of PreDiabetes (A1c 5.7% / 2009)  and has had no symptoms of reactive hypoglycemia, diabetic polys, paresthesias or visual blurring.  Last A1c was Normal & at goal:  Lab Results  Component Value Date   HGBA1C 5.4 11/12/2018                                                       Patient has hx/o Testosterone Deficiency ("220" / 2012) and deferred Tx.     Further, the patient also has history of Vitamin D Deficiency("39" / 2016)  and supplements vitamin D without any suspected side-effects. Last vitamin D was still low:  Lab Results  Component Value Date   VD25OH 46 11/12/2018    Current Outpatient Medications on File Prior to Visit  Medication Sig  . aspirin 81 MG EC tablet Take 81 mg by mouth daily.    . B Complex Vitamins (VITAMIN B COMPLEX PO) Take by mouth daily.  . Calcium Carbonate-Vit D-Min (CALCIUM 1200 PO) Take by mouth daily.  . Cholecalciferol (VITAMIN D PO) Take 5,000 Units by mouth daily.  . ferrous fumarate (HEMOCYTE - 106 MG FE) 325 (106 FE) MG TABS tablet Take 1 tablet by mouth.  Marland Kitchen  glucosamine-chondroitin 500-400 MG tablet Take 1 tablet by mouth 2 (two) times daily.  . meloxicam (MOBIC) 15 MG tablet Take 1 tablet (15 mg total) by mouth daily.  . Multiple Vitamin (MULTIVITAMIN) tablet Take 1 tablet by mouth daily.  . Omega-3 Fatty Acids (OMEGA 3 PO) Take by mouth daily.  Marland Kitchen [place]  . rosuvastatin (CRESTOR) 40 MG tablet Take 1/2 to 1 tablet Daily for Cholesterol   No current facility-administered medications on file prior to visit.   NKA  PMHx:   Past Medical History:  Diagnosis Date  . GERD (gastroesophageal reflux disease)   . History of ETT    cardiolite > 10 years ago was normal per patient's report  . Hyperlipidemia   . Low testosterone   . OSA (obstructive sleep apnea)   . Sciatica     Immunization History  Administered Date(s) Administered  . Hepatitis B, ped/adol 09/02/2013, 10/01/2013, 01/20/2014  . Influenza-Unspecified 01/03/2018  . PPD Test 10/27/2013, 12/10/2014, 05/08/2016, 06/17/2017, 11/12/2018  . Pneumococcal Polysaccharide-23 01/12/2008, 06/17/2017  .  Tdap 02/10/2008, 11/12/2018    Past Surgical History:  Procedure Laterality Date  . APPENDECTOMY      FHx:    Reviewed / unchanged  SHx:    Reviewed / unchanged   Systems Review:  Constitutional: Denies fever, chills, wt changes, headaches, insomnia, fatigue, night sweats, change in appetite. Eyes: Denies redness, blurred vision, diplopia, discharge, itchy, watery eyes.  ENT: Denies discharge, congestion, post nasal drip, epistaxis, sore throat, earache, hearing loss, dental pain, tinnitus, vertigo, sinus pain, snoring.  CV: Denies chest pain, palpitations, irregular heartbeat, syncope, dyspnea, diaphoresis, orthopnea, PND, claudication or edema. Respiratory: denies cough, dyspnea, DOE, pleurisy, hoarseness, laryngitis, wheezing.  Gastrointestinal: Denies dysphagia, odynophagia, heartburn, reflux, water brash, abdominal pain or cramps,  nausea, vomiting, bloating, diarrhea, constipation, hematemesis, melena, hematochezia  or hemorrhoids. Genitourinary: Denies dysuria, frequency, urgency, nocturia, hesitancy, discharge, hematuria or flank pain. Musculoskeletal: Denies arthralgias, myalgias, stiffness, jt. swelling, pain, limping or strain/sprain.  Skin: Denies pruritus, rash, hives, warts, acne, eczema or change in skin lesion(s). Neuro: No weakness, tremor, incoordination, spasms, paresthesia or pain. Psychiatric: Denies confusion, memory loss or sensory loss. Endo: Denies change in weight, skin or hair change.  Heme/Lymph: No excessive bleeding, bruising or enlarged lymph nodes.  Physical Exam  BP 122/82   Pulse 72   Temp 97.6 F (36.4 C)   Resp 16   Ht 5' 10.5" (1.791 m)   Wt 211 lb 12.8 oz (96.1 kg)   BMI 29.96 kg/m   Appears  well nourished, well groomed  and in no distress.  Eyes: PERRLA, EOMs, conjunctiva no swelling or erythema. Sinuses: No frontal/maxillary tenderness ENT/Mouth: EAC's clear, TM's nl w/o erythema, bulging. Nares clear w/o erythema, swelling, exudates. Oropharynx clear without erythema or exudates. Oral hygiene is good. Tongue normal, non obstructing. Hearing intact.  Neck: Supple. Thyroid not palpable. Car 2+/2+ without bruits, nodes or JVD. Chest: Respirations nl with BS clear & equal w/o rales, rhonchi, wheezing or stridor.  Cor: Heart sounds normal w/ regular rate and rhythm without sig. murmurs, gallops, clicks or rubs. Peripheral pulses normal and equal  without edema.  Abdomen: Soft, rotund with redundant panniculus.  Bowel sounds normal. Non-tender w/o guarding, rebound, hernias, masses or organomegaly.  Lymphatics: Unremarkable.  Musculoskeletal: Full ROM all peripheral extremities, joint stability, 5/5 strength and normal gait.  Skin: Warm, dry without exposed rashes, lesions or ecchymosis apparent.  Neuro: Cranial nerves intact, reflexes equal bilaterally. Sensory-motor testing  grossly intact. Tendon reflexes grossly intact.  Pysch: Alert & oriented x 3.  Insight and judgement nl & appropriate. No ideations.  Assessment and Plan:  1. Labile hypertension  - Continue medication, monitor blood pressure at home.  - Continue DASH diet.  Reminder to go to the ER if any CP,  SOB, nausea, dizziness, severe HA, changes vision/speech.  - CBC with Differential/Platelet - COMPLETE METABOLIC PANEL WITH GFR - Magnesium - TSH  2. Hyperlipidemia, mixed  - Continue diet/meds, exercise,& lifestyle modifications.  - Continue monitor periodic cholesterol/liver & renal functions   - Lipid panel - TSH  3. Abnormal glucose  - Continue diet, exercise  - Lifestyle modifications.  - Monitor appropriate labs.  - Hemoglobin A1c - Insulin, random  4. Vitamin D deficiency  - Continue supplementation.  - VITAMIN D 25 Hydroxy  5. Medication management  - CBC with Differential/Platelet - COMPLETE METABOLIC PANEL WITH GFR - Magnesium - Lipid panel - TSH - Hemoglobin A1c - Insulin, random - VITAMIN D 25 Hydroxy  Discussed  regular exercise, BP monitoring, weight control to achieve/maintain BMI less than 25 and discussed med and SE's. Recommended labs to assess and monitor clinical status with further disposition pending results of labs.  I discussed the assessment and treatment plan with the patient. The patient was provided an opportunity to ask questions and all were answered. The patient agreed with the plan and demonstrated an understanding of the instructions.  I provided over 30 minutes of exam, counseling, chart review and  complex critical decision making.         The patient was advised to call back or seek an in-person evaluation if the symptoms worsen or if the condition fails to improve as anticipated.   Kirtland Bouchard, MD

## 2019-06-02 ENCOUNTER — Ambulatory Visit: Payer: 59 | Admitting: Internal Medicine

## 2019-06-02 ENCOUNTER — Other Ambulatory Visit: Payer: Self-pay

## 2019-06-02 VITALS — BP 122/82 | HR 72 | Temp 97.6°F | Resp 16 | Ht 70.5 in | Wt 211.8 lb

## 2019-06-02 DIAGNOSIS — E782 Mixed hyperlipidemia: Secondary | ICD-10-CM | POA: Diagnosis not present

## 2019-06-02 DIAGNOSIS — Z79899 Other long term (current) drug therapy: Secondary | ICD-10-CM

## 2019-06-02 DIAGNOSIS — R0989 Other specified symptoms and signs involving the circulatory and respiratory systems: Secondary | ICD-10-CM

## 2019-06-02 DIAGNOSIS — E559 Vitamin D deficiency, unspecified: Secondary | ICD-10-CM

## 2019-06-02 DIAGNOSIS — R7309 Other abnormal glucose: Secondary | ICD-10-CM | POA: Diagnosis not present

## 2019-06-03 LAB — CBC WITH DIFFERENTIAL/PLATELET
Absolute Monocytes: 722 cells/uL (ref 200–950)
Basophils Absolute: 52 cells/uL (ref 0–200)
Basophils Relative: 0.8 %
Eosinophils Absolute: 182 cells/uL (ref 15–500)
Eosinophils Relative: 2.8 %
HCT: 46.7 % (ref 38.5–50.0)
Hemoglobin: 15.4 g/dL (ref 13.2–17.1)
Lymphs Abs: 2184 cells/uL (ref 850–3900)
MCH: 30.4 pg (ref 27.0–33.0)
MCHC: 33 g/dL (ref 32.0–36.0)
MCV: 92.1 fL (ref 80.0–100.0)
MPV: 11.8 fL (ref 7.5–12.5)
Monocytes Relative: 11.1 %
Neutro Abs: 3361 cells/uL (ref 1500–7800)
Neutrophils Relative %: 51.7 %
Platelets: 173 10*3/uL (ref 140–400)
RBC: 5.07 10*6/uL (ref 4.20–5.80)
RDW: 12.5 % (ref 11.0–15.0)
Total Lymphocyte: 33.6 %
WBC: 6.5 10*3/uL (ref 3.8–10.8)

## 2019-06-03 LAB — COMPLETE METABOLIC PANEL WITH GFR
AG Ratio: 1.6 (calc) (ref 1.0–2.5)
ALT: 16 U/L (ref 9–46)
AST: 19 U/L (ref 10–35)
Albumin: 4.5 g/dL (ref 3.6–5.1)
Alkaline phosphatase (APISO): 116 U/L (ref 35–144)
BUN: 13 mg/dL (ref 7–25)
CO2: 28 mmol/L (ref 20–32)
Calcium: 9.6 mg/dL (ref 8.6–10.3)
Chloride: 103 mmol/L (ref 98–110)
Creat: 0.93 mg/dL (ref 0.70–1.25)
GFR, Est African American: 103 mL/min/{1.73_m2} (ref >=60)
GFR, Est Non African American: 89 mL/min/{1.73_m2} (ref >=60)
Globulin: 2.8 g/dL (calc) (ref 1.9–3.7)
Glucose, Bld: 108 mg/dL — ABNORMAL HIGH (ref 65–99)
Potassium: 4.1 mmol/L (ref 3.5–5.3)
Sodium: 140 mmol/L (ref 135–146)
Total Bilirubin: 1 mg/dL (ref 0.2–1.2)
Total Protein: 7.3 g/dL (ref 6.1–8.1)

## 2019-06-03 LAB — LIPID PANEL
Cholesterol: 155 mg/dL (ref <200)
HDL: 56 mg/dL (ref >=40)
LDL Cholesterol (Calc): 77 mg/dL (calc)
Non-HDL Cholesterol (Calc): 99 mg/dL (calc) (ref <130)
Total CHOL/HDL Ratio: 2.8 (calc) (ref <5.0)
Triglycerides: 141 mg/dL (ref <150)

## 2019-06-03 LAB — HEMOGLOBIN A1C
Hgb A1c MFr Bld: 5.3 % of total Hgb (ref <5.7)
Mean Plasma Glucose: 105 (calc)
eAG (mmol/L): 5.8 (calc)

## 2019-06-03 LAB — MAGNESIUM: Magnesium: 2.4 mg/dL (ref 1.5–2.5)

## 2019-06-03 LAB — VITAMIN D 25 HYDROXY (VIT D DEFICIENCY, FRACTURES): Vit D, 25-Hydroxy: 56 ng/mL (ref 30–100)

## 2019-06-03 LAB — TSH: TSH: 1.37 mIU/L (ref 0.40–4.50)

## 2019-06-03 LAB — INSULIN, RANDOM: Insulin: 17.3 u[IU]/mL

## 2019-06-04 ENCOUNTER — Other Ambulatory Visit: Payer: Self-pay | Admitting: Internal Medicine

## 2019-06-04 MED ORDER — MELOXICAM 15 MG PO TABS: ORAL_TABLET | ORAL | 0 refills | Status: DC

## 2019-07-15 ENCOUNTER — Encounter: Payer: 59 | Admitting: Internal Medicine

## 2019-07-30 LAB — HM DIABETES EYE EXAM

## 2019-08-29 ENCOUNTER — Other Ambulatory Visit: Payer: Self-pay | Admitting: Internal Medicine

## 2019-08-29 DIAGNOSIS — E782 Mixed hyperlipidemia: Secondary | ICD-10-CM

## 2019-10-02 ENCOUNTER — Other Ambulatory Visit: Payer: Self-pay | Admitting: Internal Medicine

## 2019-10-15 ENCOUNTER — Other Ambulatory Visit: Payer: Self-pay | Admitting: *Deleted

## 2019-10-15 DIAGNOSIS — Z1211 Encounter for screening for malignant neoplasm of colon: Secondary | ICD-10-CM

## 2019-10-15 LAB — POC HEMOCCULT BLD/STL (HOME/3-CARD/SCREEN)
Card #2 Fecal Occult Blod, POC: NEGATIVE
Card #3 Fecal Occult Blood, POC: NEGATIVE
Fecal Occult Blood, POC: NEGATIVE

## 2019-12-13 ENCOUNTER — Encounter: Payer: Self-pay | Admitting: Internal Medicine

## 2019-12-13 NOTE — Progress Notes (Signed)
Annual  Screening/Preventative Visit  & Comprehensive Evaluation & Examination      This very nice 59 y.o.  MWM  presents for a Screening /Preventative Visit & comprehensive evaluation and management of multiple medical co-morbidities.  Patient has been followed for HTN, HLD, Prediabetes and Vitamin D Deficiency.   Patient has prior dx/o OSA seemingly cured after a 35# weight loss.   Patient also has GERD controlled on meds.      Patient has hx/o labile HTN followed expectantly since 2013. Patient's BP has been controlled at home.  Today's BP is at goal  - 124/78. Patient denies any cardiac symptoms as chest pain, palpitations, shortness of breath, dizziness or ankle swelling.  BP Readings from Last 3 Encounters:  06/02/19 122/82  11/26/18 116/80  11/12/18 118/72       Patient's hyperlipidemia is controlled with diet and Rosuvastatin.   Patient denies myalgias or other medication SE's. Last lipids were at goal:  Lab Results  Component Value Date   CHOL 155 06/02/2019   HDL 56 06/02/2019   LDLCALC 77 06/02/2019   TRIG 141 06/02/2019   CHOLHDL 2.8 06/02/2019       Patient has hx/o prediabetes (A1c 5.7% /2009)and patient denies reactive hypoglycemic symptoms, visual blurring, diabetic polys or paresthesias. Last A1c was Normal & at goal:  Lab Results  Component Value Date   HGBA1C 5.3 06/02/2019        Patient has hx/o Testosterone Deficiency ("220" /2012) and declined treatmment. Today he reports poor response with Cialis in the past and requests trial with Viagra. Also, he indicated interest in Testosterone injection if Testosterone levels still low.     Finally, patient has history of Vitamin D Deficiency ("39" /2016) and last vitamin D was slightly low  (goal 70-100):  Lab Results  Component Value Date   VD25OH 56 06/02/2019    Current Outpatient Medications on File Prior to Visit  Medication Sig  . aspirin 81 MG EC tablet Take 81 mg by mouth daily.    . B Complex  Vitamins Take by mouth daily.  . Calcium 1200 -Vit D Take by mouth daily.  . Cholecalciferol (VITAMIN D PO) Take 5,000 Units by mouth daily.  . ferrous fumarate 325 (106 FE) MG TABS tablet Take 1 tablet by mouth.  Marland Kitchen glucosamine-chondroitin 500-400 MG tablet Take 1 tablet by mouth 2 (two) times daily.  . meloxicam (MOBIC) 15 MG tablet Take 1/2 to 1 tablet    Daily      . Multiple Vitamin (MULTIVITAMIN) tablet Take 1 tablet by mouth daily.  . Omega-3 Fatty Acids (OMEGA 3 PO) Take by mouth daily.  Marland Kitchen OVER THE COUNTER MEDICATION [facility]  . rosuvastatin (CRESTOR) 40 MG tablet Take 1 tablet Daily for Cholesterol    No Known Allergies  Past Medical History:  Diagnosis Date  . GERD (gastroesophageal reflux disease)   . History of ETT    cardiolite > 10 years ago was normal per patient's report  . Hyperlipidemia   . Low testosterone   . OSA (obstructive sleep apnea)   . Sciatica    Health Maintenance  Topic Date Due  . COVID-19 Vaccine (1) Never done  . FOOT EXAM  10/28/2014  . OPHTHALMOLOGY EXAM  04/01/2018  . COLONOSCOPY  06/28/2019  . INFLUENZA VACCINE  10/11/2019  . URINE MICROALBUMIN  11/12/2019  . HEMOGLOBIN A1C  12/03/2019  . TETANUS/TDAP  11/11/2028  . PNEUMOCOCCAL POLYSACCHARIDE VACCINE AGE 70-64 HIGH RISK  Completed  . Hepatitis C Screening  Completed  . HIV Screening  Completed   Immunization History  Administered Date(s) Administered  . Hepatitis B, ped/adol 09/02/2013, 10/01/2013, 01/20/2014  . Influenza-Unspecified 01/03/2018  . PPD Test 10/27/2013, 12/10/2014, 05/08/2016, 06/17/2017, 11/12/2018  . Pneumococcal Polysaccharide-23 01/12/2008, 06/17/2017  . Tdap 02/10/2008, 11/12/2018   Last Colon - 06/2009 Dr Kinnie Scales  Past Surgical History:  Procedure Laterality Date  . APPENDECTOMY     Family History  Problem Relation Age of Onset  . Heart attack Father 41  . Heart attack Brother 8  . Heart attack Sister 88   Social History    Socioeconomic History  . Marital status: Married    Spouse name: Clydie Braun  . Number of children: 2 daughters & 3 Grandsons  Occupational History  . Occupation: Research officer, trade union for [employer]    Comment: [employer]  Tobacco Use  . Smoking status: Former Smoker    Quit date: 03/12/1986    Years since quitting: 33.7  . Smokeless tobacco: Never Used  Substance and Sexual Activity  . Alcohol use: Yes, very rarely    Alcohol/week: 0.0 standard drinks  . Drug use: No  . Sexual activity: Not on file  Social History Narrative   Lives in [place]    ROS Constitutional: Denies fever, chills, weight loss/gain, headaches, insomnia,  night sweats or change in appetite. Does c/o fatigue. Eyes: Denies redness, blurred vision, diplopia, discharge, itchy or watery eyes.  ENT: Denies discharge, congestion, post nasal drip, epistaxis, sore throat, earache, hearing loss, dental pain, Tinnitus, Vertigo, Sinus pain or snoring.  Cardio: Denies chest pain, palpitations, irregular heartbeat, syncope, dyspnea, diaphoresis, orthopnea, PND, claudication or edema Respiratory: denies cough, dyspnea, DOE, pleurisy, hoarseness, laryngitis or wheezing.  Gastrointestinal: Denies dysphagia, heartburn, reflux, water brash, pain, cramps, nausea, vomiting, bloating, diarrhea, constipation, hematemesis, melena, hematochezia, jaundice or hemorrhoids Genitourinary: Denies dysuria, frequency, urgency, nocturia, hesitancy, discharge, hematuria or flank pain Musculoskeletal: Denies arthralgia, myalgia, stiffness, Jt. Swelling, pain, limp or strain/sprain. Denies Falls. Skin: Denies puritis, rash, hives, warts, acne, eczema or change in skin lesion Neuro: No weakness, tremor, incoordination, spasms, paresthesia or pain Psychiatric: Denies confusion, memory loss or sensory loss. Denies Depression. Endocrine: Denies change in weight, skin, hair change, nocturia, and paresthesia, diabetic polys, visual blurring or hyper / hypo  glycemic episodes.  Heme/Lymph: No excessive bleeding, bruising or enlarged lymph nodes.  Physical Exam  BP 124/78   Pulse 85   Temp (!) 97.4 F (36.3 C)   Resp 16   Ht 5' 10.5" (1.791 m)   Wt 216 lb 12.8 oz (98.3 kg)   SpO2 94%   BMI 30.67 kg/m   General Appearance: Well nourished and well groomed and in no apparent distress.  Eyes: PERRLA, EOMs, conjunctiva no swelling or erythema, normal fundi and vessels. Sinuses: No frontal/maxillary tenderness ENT/Mouth: EACs patent / TMs  nl. Nares clear without erythema, swelling, mucoid exudates. Oral hygiene is good. No erythema, swelling, or exudate. Tongue normal, non-obstructing. Tonsils not swollen or erythematous. Hearing normal.  Neck: Supple, thyroid not palpable. No bruits, nodes or JVD. Respiratory: Respiratory effort normal.  BS equal and clear bilateral without rales, rhonci, wheezing or stridor. Cardio: Heart sounds are normal with regular rate and rhythm and no murmurs, rubs or gallops. Peripheral pulses are normal and equal bilaterally without edema. No aortic or femoral bruits. Chest: symmetric with normal excursions and percussion.  Abdomen: Soft, with Nl bowel sounds. Nontender, no guarding, rebound, hernias, masses, or organomegaly.  Lymphatics: Non tender without lymphadenopathy.  Musculoskeletal: Full ROM all peripheral extremities, joint stability, 5/5 strength, and normal gait. Skin: Warm and dry without rashes, lesions, cyanosis, clubbing or  ecchymosis.  Neuro: Cranial nerves intact, reflexes equal bilaterally. Normal muscle tone, no cerebellar symptoms. Sensation intact.  Pysch: Alert and oriented X 3 with normal affect, insight and judgment appropriate.   Assessment and Plan  1. Annual Preventative/Screening Exam   2. Labile hypertension  - EKG 12-Lead - [place], RETROPERITNL ABD,  LTD - Urinalysis, Routine w reflex microscopic - Microalbumin / creatinine urine ratio - CBC with Differential/Platelet - COMPLETE  METABOLIC PANEL WITH GFR - Magnesium - TSH  3. Hyperlipidemia, mixed  - EKG 12-Lead - [place], RETROPERITNL ABD,  LTD - Lipid panel - TSH  4. Abnormal glucose  - EKG 12-Lead - [place], RETROPERITNL ABD,  LTD - Hemoglobin A1c - Insulin, random  5. Vitamin D deficiency  - VITAMIN D 25 Hydroxy (Vit-D Deficiency, Fractures)  6. Testosterone deficiency  - Testosterone  7. Screening examination for pulmonary tuberculosis  - TB Skin Test  8. Screening for colorectal cancer  - POC Hemoccult Bld/Stl (3-Cd Home Screen); Future  9. Prostate cancer screening  - PSA  10. Screening for ischemic heart disease  - EKG 12-Lead  11. FHx: heart disease  - EKG 12-Lead - [place], RETROPERITNL ABD,  LTD  12. Former smoker  - EKG 12-Lead - [place], RETROPERITNL ABD,  LTD  13. Screening for AAA (aortic abdominal aneurysm)  - [place], RETROPERITNL ABD,  LTD  14. Fatigue, unspecified type  - Iron,Total/Total Iron Binding Cap - Vitamin B12 - Testosterone - CBC with Differential/Platelet - TSH  15. Medication management  - Urinalysis, Routine w reflex microscopic - Microalbumin / creatinine urine ratio - CBC with Differential/Platelet - COMPLETE METABOLIC PANEL WITH GFR - Magnesium - Lipid panel - TSH - Hemoglobin A1c - Insulin, random - VITAMIN D 25 Hydroxy          Patient was counseled in prudent diet, weight control to achieve/maintain BMI less than 25, BP monitoring, regular exercise and medications as discussed.  Discussed med effects and SE's. Routine screening labs and tests as requested with regular follow-up as recommended. Over 40 minutes of exam, counseling, chart review and high complex critical decision making was performed   Marinus Maw, MD

## 2019-12-13 NOTE — Patient Instructions (Signed)

## 2019-12-14 ENCOUNTER — Ambulatory Visit (INDEPENDENT_AMBULATORY_CARE_PROVIDER_SITE_OTHER): Payer: 59 | Admitting: Internal Medicine

## 2019-12-14 ENCOUNTER — Other Ambulatory Visit: Payer: Self-pay

## 2019-12-14 VITALS — BP 124/78 | HR 85 | Temp 97.4°F | Resp 16 | Ht 70.5 in | Wt 216.8 lb

## 2019-12-14 DIAGNOSIS — Z111 Encounter for screening for respiratory tuberculosis: Secondary | ICD-10-CM | POA: Diagnosis not present

## 2019-12-14 DIAGNOSIS — Z8249 Family history of ischemic heart disease and other diseases of the circulatory system: Secondary | ICD-10-CM | POA: Diagnosis not present

## 2019-12-14 DIAGNOSIS — Z87891 Personal history of nicotine dependence: Secondary | ICD-10-CM

## 2019-12-14 DIAGNOSIS — Z79899 Other long term (current) drug therapy: Secondary | ICD-10-CM

## 2019-12-14 DIAGNOSIS — Z0000 Encounter for general adult medical examination without abnormal findings: Secondary | ICD-10-CM

## 2019-12-14 DIAGNOSIS — R0989 Other specified symptoms and signs involving the circulatory and respiratory systems: Secondary | ICD-10-CM

## 2019-12-14 DIAGNOSIS — E559 Vitamin D deficiency, unspecified: Secondary | ICD-10-CM

## 2019-12-14 DIAGNOSIS — Z125 Encounter for screening for malignant neoplasm of prostate: Secondary | ICD-10-CM

## 2019-12-14 DIAGNOSIS — Z1212 Encounter for screening for malignant neoplasm of rectum: Secondary | ICD-10-CM

## 2019-12-14 DIAGNOSIS — E349 Endocrine disorder, unspecified: Secondary | ICD-10-CM

## 2019-12-14 DIAGNOSIS — Z136 Encounter for screening for cardiovascular disorders: Secondary | ICD-10-CM

## 2019-12-14 DIAGNOSIS — R7309 Other abnormal glucose: Secondary | ICD-10-CM

## 2019-12-14 DIAGNOSIS — R5383 Other fatigue: Secondary | ICD-10-CM

## 2019-12-14 DIAGNOSIS — Z1211 Encounter for screening for malignant neoplasm of colon: Secondary | ICD-10-CM

## 2019-12-14 DIAGNOSIS — E782 Mixed hyperlipidemia: Secondary | ICD-10-CM

## 2019-12-14 DIAGNOSIS — Z0001 Encounter for general adult medical examination with abnormal findings: Secondary | ICD-10-CM

## 2019-12-14 MED ORDER — SILDENAFIL CITRATE 100 MG PO TABS: ORAL_TABLET | ORAL | 2 refills | Status: DC

## 2019-12-15 LAB — CBC WITH DIFFERENTIAL/PLATELET
Absolute Monocytes: 718 cells/uL (ref 200–950)
Basophils Absolute: 81 cells/uL (ref 0–200)
Basophils Relative: 1.1 %
Eosinophils Absolute: 237 cells/uL (ref 15–500)
Eosinophils Relative: 3.2 %
HCT: 45.7 % (ref 38.5–50.0)
Hemoglobin: 15.6 g/dL (ref 13.2–17.1)
Lymphs Abs: 2464 cells/uL (ref 850–3900)
MCH: 31.5 pg (ref 27.0–33.0)
MCHC: 34.1 g/dL (ref 32.0–36.0)
MCV: 92.1 fL (ref 80.0–100.0)
MPV: 12.2 fL (ref 7.5–12.5)
Monocytes Relative: 9.7 %
Neutro Abs: 3900 cells/uL (ref 1500–7800)
Neutrophils Relative %: 52.7 %
Platelets: 155 10*3/uL (ref 140–400)
RBC: 4.96 10*6/uL (ref 4.20–5.80)
RDW: 12.4 % (ref 11.0–15.0)
Total Lymphocyte: 33.3 %
WBC: 7.4 10*3/uL (ref 3.8–10.8)

## 2019-12-15 LAB — MICROALBUMIN / CREATININE URINE RATIO
Creatinine, Urine: 239 mg/dL (ref 20–320)
Microalb Creat Ratio: 4 mcg/mg creat (ref <30)
Microalb, Ur: 1 mg/dL

## 2019-12-15 LAB — COMPLETE METABOLIC PANEL WITH GFR
AG Ratio: 1.8 (calc) (ref 1.0–2.5)
ALT: 18 U/L (ref 9–46)
AST: 17 U/L (ref 10–35)
Albumin: 4.5 g/dL (ref 3.6–5.1)
Alkaline phosphatase (APISO): 103 U/L (ref 35–144)
BUN: 16 mg/dL (ref 7–25)
CO2: 27 mmol/L (ref 20–32)
Calcium: 9.8 mg/dL (ref 8.6–10.3)
Chloride: 106 mmol/L (ref 98–110)
Creat: 1.02 mg/dL (ref 0.70–1.25)
GFR, Est African American: 92 mL/min/{1.73_m2} (ref >=60)
GFR, Est Non African American: 79 mL/min/{1.73_m2} (ref >=60)
Globulin: 2.5 g/dL (calc) (ref 1.9–3.7)
Glucose, Bld: 107 mg/dL — ABNORMAL HIGH (ref 65–99)
Potassium: 4.8 mmol/L (ref 3.5–5.3)
Sodium: 140 mmol/L (ref 135–146)
Total Bilirubin: 0.8 mg/dL (ref 0.2–1.2)
Total Protein: 7 g/dL (ref 6.1–8.1)

## 2019-12-15 LAB — URINALYSIS, ROUTINE W REFLEX MICROSCOPIC
Bilirubin Urine: NEGATIVE
Glucose, UA: NEGATIVE
Hgb urine dipstick: NEGATIVE
Leukocytes,Ua: NEGATIVE
Nitrite: NEGATIVE
Protein, ur: NEGATIVE
Specific Gravity, Urine: 1.026 (ref 1.001–1.03)
pH: <=5 (ref 5.0–8.0)

## 2019-12-15 LAB — LIPID PANEL
Cholesterol: 147 mg/dL (ref <200)
HDL: 60 mg/dL (ref >=40)
LDL Cholesterol (Calc): 64 mg/dL (calc)
Non-HDL Cholesterol (Calc): 87 mg/dL (calc) (ref <130)
Total CHOL/HDL Ratio: 2.5 (calc) (ref <5.0)
Triglycerides: 147 mg/dL (ref <150)

## 2019-12-15 LAB — INSULIN, RANDOM: Insulin: 15.7 u[IU]/mL

## 2019-12-15 LAB — IRON, TOTAL/TOTAL IRON BINDING CAP
%SAT: 26 % (calc) (ref 20–48)
Iron: 90 ug/dL (ref 50–180)
TIBC: 341 mcg/dL (calc) (ref 250–425)

## 2019-12-15 LAB — MAGNESIUM: Magnesium: 2.2 mg/dL (ref 1.5–2.5)

## 2019-12-15 LAB — PSA: PSA: 0.74 ng/mL (ref <=4.0)

## 2019-12-15 LAB — TESTOSTERONE: Testosterone: 303 ng/dL (ref 250–827)

## 2019-12-15 LAB — TSH: TSH: 1.32 mIU/L (ref 0.40–4.50)

## 2019-12-15 LAB — HEMOGLOBIN A1C
Hgb A1c MFr Bld: 5.5 % of total Hgb (ref <5.7)
Mean Plasma Glucose: 111 (calc)
eAG (mmol/L): 6.2 (calc)

## 2019-12-15 LAB — VITAMIN D 25 HYDROXY (VIT D DEFICIENCY, FRACTURES): Vit D, 25-Hydroxy: 48 ng/mL (ref 30–100)

## 2019-12-15 LAB — VITAMIN B12: Vitamin B-12: 474 pg/mL (ref 200–1100)

## 2019-12-15 NOTE — Progress Notes (Signed)
========================================================== °-   Test results slightly outside the reference range are not unusual. If there is anything important, I will review this with you,  otherwise it is considered normal test values.  If you have further questions,  please do not hesitate to contact me at the office or via My Chart.  ==========================================================  -  Iron level - Normal  - Vitamin level = 474 - low level of acceptable range  (Ideal or Goal is between 450 - 1,100)  - Recommend take a B- Complex Multi supplement  ==========================================================  -  PSA - Low  ==========================================================  -  Testosterone = 303 in the "low"  Normal Range   - Recommend take Zinc 50 mg Daily which usually helps raise  Testosterone levels Normally   - Then can recheck Testosterone level when you come in for your  3 month Cholesterol / liver check since on Rosuvastatin  ==========================================================  -  Total Chol = 147 and LDL Chol = 64 - Both  Excellent   - Very low risk for Heart Attack  / Stroke =============================================================  - A1c - Normal - Great - No Diabetes ==========================================================  -  Vitamin D = 48 - Low   - Vitamin D goal is between 70-100.   $$$$$$$$$$$$$$$$$$$$$$$$$$$$$$$$$$$$$$$$$$$$$$$$$$$$$$$$$$$$$  - Please INCREASE your Vitamin D 5,000 unit capsule to  2 capsules = 10,000 units /daily  $$$$$$$$$$$$$$$$$$$$$$$$$$$$$$$$$$$$$$$$$$$$$$$$$$$$$$$$$$$$$  - It is very important as a natural anti-inflammatory and helping the  immune system protect against viral infections, like the Covid-19    helping hair, skin, and nails, as well as reducing stroke and  heart attack risk.   - It helps your bones and helps with mood.  - It also decreases numerous cancer risks so please take   it as directed.   - Low Vit D is associated with a 200-300% higher risk for CANCER   and 200-300% higher risk for HEART   ATTACK  &  STROKE.    - It is also associated with higher death rate at younger ages,   autoimmune diseases like Rheumatoid arthritis, Lupus,  Multiple Sclerosis.     - Also many other serious conditions, like depression, Alzheimer's  Dementia, infertility, muscle aches, fatigue, fibromyalgia  - just to name a few.  ==========================================================  - All Else - CBC - Kidneys - Electrolytes - Liver - Magnesium & Thyroid    - all  Normal / OK ==========================================================   - Keep up the [place] Work ! ==========================================================

## 2019-12-30 ENCOUNTER — Other Ambulatory Visit: Payer: Self-pay | Admitting: Internal Medicine

## 2019-12-30 DIAGNOSIS — E782 Mixed hyperlipidemia: Secondary | ICD-10-CM

## 2020-03-02 ENCOUNTER — Encounter: Payer: Self-pay | Admitting: *Deleted

## 2020-03-17 ENCOUNTER — Telehealth: Payer: Self-pay | Admitting: *Deleted

## 2020-03-17 ENCOUNTER — Encounter: Payer: Self-pay | Admitting: *Deleted

## 2020-03-17 ENCOUNTER — Other Ambulatory Visit: Payer: Self-pay | Admitting: Internal Medicine

## 2020-03-17 DIAGNOSIS — U071 COVID-19: Secondary | ICD-10-CM | POA: Insufficient documentation

## 2020-03-17 MED ORDER — DEXAMETHASONE 4 MG PO TABS: ORAL_TABLET | ORAL | 0 refills | Status: DC

## 2020-03-17 MED ORDER — PROMETHAZINE-DM 6.25-15 MG/5ML PO SYRP
ORAL_SOLUTION | ORAL | 1 refills | Status: DC
Start: 2020-03-17 — End: 2020-09-06

## 2020-03-17 NOTE — Telephone Encounter (Signed)
Patient called and tested positive for Covid today. Complained of hoarse voice, cough slight fever and producing clear mucus. Patient has been vaccinated for Covid and does not qualify for infusion, but Dr Oneta Rack sent in cough RX and Dexamethazone to his pharmacy. Patient advised to call back, if mucus becomes colored.

## 2020-03-17 NOTE — Telephone Encounter (Signed)
left a message to call regarding infusion. Patient tested positive for Covid.

## 2020-03-18 ENCOUNTER — Other Ambulatory Visit: Payer: Self-pay | Admitting: Internal Medicine

## 2020-03-18 MED ORDER — AZITHROMYCIN 250 MG PO TABS: ORAL_TABLET | ORAL | 1 refills | Status: DC

## 2020-04-23 ENCOUNTER — Other Ambulatory Visit: Payer: Self-pay | Admitting: Internal Medicine

## 2020-07-23 ENCOUNTER — Other Ambulatory Visit: Payer: Self-pay | Admitting: Internal Medicine

## 2020-09-05 ENCOUNTER — Emergency Department (HOSPITAL_BASED_OUTPATIENT_CLINIC_OR_DEPARTMENT_OTHER): Payer: No Typology Code available for payment source

## 2020-09-05 ENCOUNTER — Other Ambulatory Visit: Payer: Self-pay

## 2020-09-05 ENCOUNTER — Ambulatory Visit (HOSPITAL_BASED_OUTPATIENT_CLINIC_OR_DEPARTMENT_OTHER)
Admission: EM | Admit: 2020-09-05 | Discharge: 2020-09-06 | Disposition: A | Discharge status: Home / Self Care | Payer: No Typology Code available for payment source | Attending: Surgery | Admitting: Surgery

## 2020-09-05 ENCOUNTER — Encounter (HOSPITAL_BASED_OUTPATIENT_CLINIC_OR_DEPARTMENT_OTHER): Payer: Self-pay

## 2020-09-05 DIAGNOSIS — R1011 Right upper quadrant pain: Secondary | ICD-10-CM

## 2020-09-05 DIAGNOSIS — Z7952 Long term (current) use of systemic steroids: Secondary | ICD-10-CM | POA: Insufficient documentation

## 2020-09-05 DIAGNOSIS — Z87891 Personal history of nicotine dependence: Secondary | ICD-10-CM | POA: Diagnosis not present

## 2020-09-05 DIAGNOSIS — Z419 Encounter for procedure for purposes other than remedying health state, unspecified: Secondary | ICD-10-CM

## 2020-09-05 DIAGNOSIS — K8012 Calculus of gallbladder with acute and chronic cholecystitis without obstruction: Principal | ICD-10-CM | POA: Insufficient documentation

## 2020-09-05 DIAGNOSIS — Z20822 Contact with and (suspected) exposure to covid-19: Secondary | ICD-10-CM | POA: Insufficient documentation

## 2020-09-05 DIAGNOSIS — K219 Gastro-esophageal reflux disease without esophagitis: Secondary | ICD-10-CM | POA: Diagnosis not present

## 2020-09-05 DIAGNOSIS — Z791 Long term (current) use of non-steroidal anti-inflammatories (NSAID): Secondary | ICD-10-CM | POA: Diagnosis not present

## 2020-09-05 DIAGNOSIS — E785 Hyperlipidemia, unspecified: Secondary | ICD-10-CM | POA: Diagnosis not present

## 2020-09-05 DIAGNOSIS — K810 Acute cholecystitis: Secondary | ICD-10-CM | POA: Diagnosis present

## 2020-09-05 DIAGNOSIS — Z8249 Family history of ischemic heart disease and other diseases of the circulatory system: Secondary | ICD-10-CM | POA: Diagnosis not present

## 2020-09-05 LAB — COMPREHENSIVE METABOLIC PANEL
ALT: 75 U/L — ABNORMAL HIGH (ref 0–44)
AST: 125 U/L — ABNORMAL HIGH (ref 15–41)
Albumin: 4.2 g/dL (ref 3.5–5.0)
Alkaline Phosphatase: 106 U/L (ref 38–126)
Anion gap: 11 (ref 5–15)
BUN: 20 mg/dL (ref 8–23)
CO2: 27 mmol/L (ref 22–32)
Calcium: 10.2 mg/dL (ref 8.9–10.3)
Chloride: 101 mmol/L (ref 98–111)
Creatinine, Ser: 0.83 mg/dL (ref 0.61–1.24)
GFR, Estimated: >60 mL/min (ref >60)
Glucose, Bld: 125 mg/dL — ABNORMAL HIGH (ref 70–99)
Potassium: 3.8 mmol/L (ref 3.5–5.1)
Sodium: 139 mmol/L (ref 135–145)
Total Bilirubin: 1.4 mg/dL — ABNORMAL HIGH (ref 0.3–1.2)
Total Protein: 7 g/dL (ref 6.5–8.1)

## 2020-09-05 LAB — CBC
HCT: 44.5 % (ref 39.0–52.0)
Hemoglobin: 14.7 g/dL (ref 13.0–17.0)
MCH: 30 pg (ref 26.0–34.0)
MCHC: 33 g/dL (ref 30.0–36.0)
MCV: 90.8 fL (ref 80.0–100.0)
Platelets: 147 10*3/uL — ABNORMAL LOW (ref 150–400)
RBC: 4.9 MIL/uL (ref 4.22–5.81)
RDW: 13.3 % (ref 11.5–15.5)
WBC: 10.7 10*3/uL — ABNORMAL HIGH (ref 4.0–10.5)
nRBC: 0 % (ref 0.0–0.2)

## 2020-09-05 LAB — URINALYSIS, ROUTINE W REFLEX MICROSCOPIC
Bilirubin Urine: NEGATIVE
Glucose, UA: NEGATIVE mg/dL
Hgb urine dipstick: NEGATIVE
Ketones, ur: NEGATIVE mg/dL
Leukocytes,Ua: NEGATIVE
Nitrite: NEGATIVE
Protein, ur: NEGATIVE mg/dL
Specific Gravity, Urine: 1.024 (ref 1.005–1.030)
pH: 6 (ref 5.0–8.0)

## 2020-09-05 LAB — LIPASE, BLOOD: Lipase: 36 U/L (ref 11–51)

## 2020-09-05 LAB — RESP PANEL BY RT-PCR (FLU A&B, COVID) ARPGX2
Influenza A by PCR: NEGATIVE
Influenza B by PCR: NEGATIVE
SARS Coronavirus 2 by RT PCR: NEGATIVE

## 2020-09-05 MED ORDER — PIPERACILLIN-TAZOBACTAM 3.375 G IVPB 30 MIN
3.3750 g | Freq: Once | INTRAVENOUS | Status: AC
  Administered 2020-09-05: 3.375 g via INTRAVENOUS
  Filled 2020-09-05: qty 50

## 2020-09-05 MED ORDER — SODIUM CHLORIDE 0.9 % IV SOLN
INTRAVENOUS | Status: DC | PRN
  Administered 2020-09-05: 500 mL via INTRAVENOUS

## 2020-09-05 NOTE — ED Triage Notes (Signed)
Pt came in for epigastric pain that Saturday night - describes as bloated and stabbing on RUQ   Aslo states had fever and blood stool yesterday   LBM -  today

## 2020-09-05 NOTE — ED Provider Notes (Signed)
[facility] EMERGENCY DEPT Provider Note   CSN: [id number] Arrival date & time: 09/05/20  1954     History Chief Complaint  Patient presents with   Abdominal Pain    Erik Salas is a 60 y.o. male.  Patient presents with right upper quadrant pain ongoing for 3 days now.  States he had fevers yesterday.  Describes the pain as sharp and persistent for the last few days now.  Otherwise nonradiating.  No associated vomiting.  He has had 4-5 loose bowel movements today.      Past Medical History:  Diagnosis Date   GERD (gastroesophageal reflux disease)    History of ETT    cardiolite > 10 years ago was normal per patient's report   Hyperlipidemia    Low testosterone    OSA (obstructive sleep apnea)    Sciatica     Patient Active Problem List   Diagnosis Date Noted   COVID 03/17/2020   BMI 36.0-36.9,adult 12/10/2014   Medication management 05/20/2014   Prediabetes 10/27/2013   Essential hypertension 06/03/2013   Testosterone deficiency 06/03/2013   GERD 06/03/2013   OSA on CPAP 06/03/2013   Obesity (BMI 36) 06/03/2013   Vitamin D deficiency 06/03/2013   Hyperlipidemia 04/28/2009    Past Surgical History:  Procedure Laterality Date   APPENDECTOMY         Family History  Problem Relation Age of Onset   Heart attack Father 56   Heart attack Brother 76   Heart attack Sister 98    Social History   Tobacco Use   Smoking status: Former    Pack years: 0.00    Types: Cigarettes    Quit date: 03/12/1986    Years since quitting: 34.5   Smokeless tobacco: Never  Substance Use Topics   Alcohol use: Yes    Alcohol/week: 0.0 standard drinks    Comment: very rarely    Home Medications Prior to Admission medications   Medication Sig Start Date End Date Taking? Authorizing Provider  aspirin 81 MG EC tablet Take 81 mg by mouth daily.      [provider]  azithromycin (ZITHROMAX) 250 MG tablet Take 2 tablets with Food on  Day 1, then 1  tablet Daily with Food for Sinusitis / Bronchitis 03/18/20   Lucky Cowboy, MD  B Complex Vitamins (VITAMIN B COMPLEX PO) Take by mouth daily.    [provider]  Calcium Carbonate-Vit D-Min (CALCIUM 1200 PO) Take by mouth daily.    [provider]  Cholecalciferol (VITAMIN D PO) Take 5,000 Units by mouth daily.    [provider]  dexamethasone (DECADRON) 4 MG tablet Take 1 tab 3 x /day for 2 days,      then 2 x /day for 2  Days,     then 1 tab daily 03/17/20   Lucky Cowboy, MD  ferrous fumarate (HEMOCYTE - 106 MG FE) 325 (106 FE) MG TABS tablet Take 1 tablet by mouth.    [provider]  glucosamine-chondroitin 500-400 MG tablet Take 1 tablet by mouth 2 (two) times daily.    [provider]  meloxicam (MOBIC) 15 MG tablet Take  1/2 to 1 tablet  Daily  with Food  for Pain & Inflammation & try limit to 5 days /week to Avoid Kidney Damage 07/23/20   Lucky Cowboy, MD  Multiple Vitamin (MULTIVITAMIN) tablet Take 1 tablet by mouth daily.    [provider]  OVER THE COUNTER MEDICATION [employer]  Prostrate Health    [provider]  promethazine-dextromethorphan (PROMETHAZINE-DM) 6.25-15 MG/5ML syrup Take 1 to 2 tsp enery 4 hours if needed for cough 03/17/20   Lucky Cowboy, MD  rosuvastatin (CRESTOR) 40 MG tablet Take      1 tablet      Daily       for Cholesterol 12/30/19   Lucky Cowboy, MD  sildenafil (VIAGRA) 100 MG tablet Take       1/2 to 1 tablet         Daily         as needed for XXXX 12/14/19   Lucky Cowboy, MD    Allergies    Patient has no known allergies.  Review of Systems   Review of Systems  Constitutional:  Negative for fever.  HENT:  Negative for ear pain and sore throat.   Eyes:  Negative for pain.  Respiratory:  Negative for cough.   Cardiovascular:  Negative for chest pain.  Gastrointestinal:  Positive for abdominal pain.  Genitourinary:  Negative for flank pain.  Musculoskeletal:  Negative for  back pain.  Skin:  Negative for color change and rash.  Neurological:  Negative for syncope.  All other systems reviewed and are negative.  Physical Exam Updated Vital Signs BP 117/84 (BP Location: Right Arm)   Pulse 85   Temp 98.6 F (37 C) (Oral)   Resp 18   Ht 5\' 9"  (1.753 m)   Wt 94.3 kg   SpO2 97%   BMI 30.72 kg/m   Physical Exam Constitutional:      General: He is not in acute distress.    Appearance: He is well-developed.  HENT:     Head: Normocephalic.     Nose: Nose normal.  Eyes:     Extraocular Movements: Extraocular movements intact.  Cardiovascular:     Rate and Rhythm: Normal rate.  Pulmonary:     Effort: Pulmonary effort is normal.  Abdominal:     Tenderness: There is abdominal tenderness.     Comments: Right upper quadrant tenderness on exam.  Skin:    Coloration: Skin is not jaundiced.  Neurological:     Mental Status: He is alert. Mental status is at baseline.    ED Results / Procedures / Treatments   Labs (all labs ordered are listed, but only abnormal results are displayed) Labs Reviewed  COMPREHENSIVE METABOLIC PANEL - Abnormal; Notable for the following components:      Result Value   Glucose, Bld 125 (*)    AST 125 (*)    ALT 75 (*)    Total Bilirubin 1.4 (*)    All other components within normal limits  CBC - Abnormal; Notable for the following components:   WBC 10.7 (*)    Platelets 147 (*)    All other components within normal limits  RESP PANEL BY RT-PCR (FLU A&B, COVID) ARPGX2  LIPASE, BLOOD  URINALYSIS, ROUTINE W REFLEX MICROSCOPIC    EKG None  Radiology Abdomen Limited RUQ (LIVER/GB)  Result Date: 09/05/2020 CLINICAL DATA:  Right upper quadrant pain EXAM: ULTRASOUND ABDOMEN LIMITED RIGHT UPPER QUADRANT COMPARISON:  None. FINDINGS: Gallbladder: Sludge and small stones within the gallbladder. Slight increased wall thickness at 5 mm. Positive sonographic Murphy. Common bile duct: Diameter: 6 mm Liver: No focal lesion  identified. Within normal limits in parenchymal echogenicity. Portal vein is patent on color Doppler imaging with normal direction of blood flow towards the liver. Other: None. IMPRESSION: 1. Sludge and small  stones in the gallbladder with increased wall thickness and positive sonographic Eulah Pont, collective findings are suspicious for an acute cholecystitis. 2. Common duct diameter upper limits of normal Electronically Signed   By: Jasmine Pang M.D.   On: 09/05/2020 22:21    Procedures Procedures   Medications Ordered in ED Medications  piperacillin-tazobactam (ZOSYN) IVPB 3.375 g (has no administration in time range)    ED Course  I have reviewed the triage vital signs and the nursing notes.  Pertinent labs & imaging results that were available during my care of the patient were reviewed by me and considered in my medical decision making (see chart for details).    MDM Rules/Calculators/A&P                          Patient had a white count of 10 chemistry shows mild liver enzyme elevation T bili 1.4.  Does not shows positive sonographic Murphy's with small stones and sludge.  Gallbladder wall about 5 mm mildly distended no pericholecystic fluid noted.  Case discussed with surgery Dr. Cliffton Asters, who accepted the patient, requested transfer to [facility].   Final Clinical Impression(s) / ED Diagnoses Final diagnoses:  RUQ pain  Acute cholecystitis    Rx / DC Orders ED Discharge Orders     None        Cheryll Cockayne, MD 09/05/20 2248

## 2020-09-06 ENCOUNTER — Ambulatory Visit: Payer: 59 | Admitting: Adult Health

## 2020-09-06 ENCOUNTER — Encounter (HOSPITAL_COMMUNITY): Admission: EM | Disposition: A | Discharge status: Home / Self Care | Payer: Self-pay | Source: Home / Self Care

## 2020-09-06 ENCOUNTER — Inpatient Hospital Stay (HOSPITAL_COMMUNITY): Payer: No Typology Code available for payment source | Admitting: Anesthesiology

## 2020-09-06 ENCOUNTER — Encounter (HOSPITAL_COMMUNITY): Payer: Self-pay

## 2020-09-06 ENCOUNTER — Inpatient Hospital Stay (HOSPITAL_COMMUNITY): Payer: No Typology Code available for payment source

## 2020-09-06 DIAGNOSIS — K810 Acute cholecystitis: Secondary | ICD-10-CM | POA: Diagnosis present

## 2020-09-06 HISTORY — PX: CHOLECYSTECTOMY: SHX55

## 2020-09-06 LAB — COMPREHENSIVE METABOLIC PANEL
ALT: 506 U/L — ABNORMAL HIGH (ref 0–44)
AST: 594 U/L — ABNORMAL HIGH (ref 15–41)
Albumin: 3.4 g/dL — ABNORMAL LOW (ref 3.5–5.0)
Alkaline Phosphatase: 116 U/L (ref 38–126)
Anion gap: 15 (ref 5–15)
BUN: 13 mg/dL (ref 8–23)
CO2: 24 mmol/L (ref 22–32)
Calcium: 9.5 mg/dL (ref 8.9–10.3)
Chloride: 100 mmol/L (ref 98–111)
Creatinine, Ser: 0.91 mg/dL (ref 0.61–1.24)
GFR, Estimated: >60 mL/min (ref >60)
Glucose, Bld: 111 mg/dL — ABNORMAL HIGH (ref 70–99)
Potassium: 3.7 mmol/L (ref 3.5–5.1)
Sodium: 139 mmol/L (ref 135–145)
Total Bilirubin: 1.9 mg/dL — ABNORMAL HIGH (ref 0.3–1.2)
Total Protein: 6.2 g/dL — ABNORMAL LOW (ref 6.5–8.1)

## 2020-09-06 LAB — HIV ANTIBODY (ROUTINE TESTING W REFLEX): HIV Screen 4th Generation wRfx: NONREACTIVE

## 2020-09-06 SURGERY — LAPAROSCOPIC CHOLECYSTECTOMY WITH INTRAOPERATIVE CHOLANGIOGRAM
Anesthesia: General | Site: Abdomen

## 2020-09-06 MED ORDER — FENTANYL CITRATE (PF) 100 MCG/2ML IJ SOLN: 25.0000 ug | INTRAMUSCULAR | Status: DC | PRN

## 2020-09-06 MED ORDER — ONDANSETRON HCL 4 MG/2ML IJ SOLN
4.0000 mg | Freq: Once | INTRAMUSCULAR | Status: AC
  Administered 2020-09-06: 4 mg via INTRAVENOUS
  Filled 2020-09-06: qty 2

## 2020-09-06 MED ORDER — HEPARIN SODIUM (PORCINE) 5000 UNIT/ML IJ SOLN
5000.0000 [IU] | Freq: Three times a day (TID) | INTRAMUSCULAR | Status: DC
  Administered 2020-09-06: 5000 [IU] via SUBCUTANEOUS
  Filled 2020-09-06: qty 1

## 2020-09-06 MED ORDER — LACTATED RINGERS IV SOLN: INTRAVENOUS | Status: DC

## 2020-09-06 MED ORDER — ONDANSETRON HCL 4 MG/2ML IJ SOLN
INTRAMUSCULAR | Status: AC
  Filled 2020-09-06: qty 2

## 2020-09-06 MED ORDER — PROMETHAZINE HCL 25 MG/ML IJ SOLN: 6.2500 mg | INTRAMUSCULAR | Status: DC | PRN

## 2020-09-06 MED ORDER — 0.9 % SODIUM CHLORIDE (POUR BTL) OPTIME
TOPICAL | Status: DC | PRN
  Administered 2020-09-06: 1000 mL

## 2020-09-06 MED ORDER — ROCURONIUM BROMIDE 10 MG/ML (PF) SYRINGE
PREFILLED_SYRINGE | INTRAVENOUS | Status: AC
  Filled 2020-09-06: qty 10

## 2020-09-06 MED ORDER — OXYCODONE HCL 5 MG PO TABS
ORAL_TABLET | ORAL | Status: AC
  Administered 2020-09-06: 5 mg via ORAL
  Filled 2020-09-06: qty 1

## 2020-09-06 MED ORDER — EPHEDRINE 5 MG/ML INJ
INTRAVENOUS | Status: AC
  Filled 2020-09-06: qty 10

## 2020-09-06 MED ORDER — PROPOFOL 10 MG/ML IV BOLUS
INTRAVENOUS | Status: DC | PRN
  Administered 2020-09-06: 200 mg via INTRAVENOUS

## 2020-09-06 MED ORDER — DEXAMETHASONE SODIUM PHOSPHATE 10 MG/ML IJ SOLN
INTRAMUSCULAR | Status: DC | PRN
  Administered 2020-09-06: 5 mg via INTRAVENOUS

## 2020-09-06 MED ORDER — MIDAZOLAM HCL 2 MG/2ML IJ SOLN
INTRAMUSCULAR | Status: AC
  Filled 2020-09-06: qty 2

## 2020-09-06 MED ORDER — DIPHENHYDRAMINE HCL 12.5 MG/5ML PO ELIX
12.5000 mg | ORAL_SOLUTION | Freq: Four times a day (QID) | ORAL | Status: DC | PRN
Start: 2020-09-06 — End: 2020-09-06

## 2020-09-06 MED ORDER — ROCURONIUM BROMIDE 10 MG/ML (PF) SYRINGE
PREFILLED_SYRINGE | INTRAVENOUS | Status: DC | PRN
  Administered 2020-09-06: 60 mg via INTRAVENOUS

## 2020-09-06 MED ORDER — CHLORHEXIDINE GLUCONATE 0.12 % MT SOLN
15.0000 mL | Freq: Once | OROMUCOSAL | Status: AC
  Administered 2020-09-06: 15 mL via OROMUCOSAL
  Filled 2020-09-06: qty 15

## 2020-09-06 MED ORDER — HYDROMORPHONE HCL 1 MG/ML IJ SOLN
INTRAMUSCULAR | Status: AC
  Administered 2020-09-06: 0.5 mg via INTRAVENOUS
  Filled 2020-09-06: qty 1

## 2020-09-06 MED ORDER — DIPHENHYDRAMINE HCL 50 MG/ML IJ SOLN: 12.5000 mg | Freq: Four times a day (QID) | INTRAMUSCULAR | Status: DC | PRN

## 2020-09-06 MED ORDER — AMISULPRIDE (ANTIEMETIC) 5 MG/2ML IV SOLN: 10.0000 mg | Freq: Once | INTRAVENOUS | Status: AC | PRN

## 2020-09-06 MED ORDER — OXYCODONE HCL 5 MG PO TABS: 5.0000 mg | ORAL_TABLET | Freq: Four times a day (QID) | ORAL | 0 refills | Status: DC | PRN

## 2020-09-06 MED ORDER — ACETAMINOPHEN 500 MG PO TABS
1000.0000 mg | ORAL_TABLET | Freq: Four times a day (QID) | ORAL | Status: DC
  Administered 2020-09-06: 1000 mg via ORAL
  Filled 2020-09-06: qty 2

## 2020-09-06 MED ORDER — SODIUM CHLORIDE 0.9 % IV SOLN
2.0000 g | INTRAVENOUS | Status: DC
  Administered 2020-09-06: 2 g via INTRAVENOUS
  Filled 2020-09-06: qty 20

## 2020-09-06 MED ORDER — IBUPROFEN 400 MG PO TABS: 600.0000 mg | ORAL_TABLET | Freq: Four times a day (QID) | ORAL | Status: DC | PRN

## 2020-09-06 MED ORDER — DOCUSATE SODIUM 100 MG PO CAPS: 100.0000 mg | ORAL_CAPSULE | Freq: Two times a day (BID) | ORAL | Status: DC

## 2020-09-06 MED ORDER — AMISULPRIDE (ANTIEMETIC) 5 MG/2ML IV SOLN
INTRAVENOUS | Status: AC
  Administered 2020-09-06: 10 mg via INTRAVENOUS
  Filled 2020-09-06: qty 4

## 2020-09-06 MED ORDER — TRAMADOL HCL 50 MG PO TABS: 50.0000 mg | ORAL_TABLET | Freq: Four times a day (QID) | ORAL | Status: DC | PRN

## 2020-09-06 MED ORDER — DEXAMETHASONE SODIUM PHOSPHATE 10 MG/ML IJ SOLN
INTRAMUSCULAR | Status: AC
  Filled 2020-09-06: qty 1

## 2020-09-06 MED ORDER — KETOROLAC TROMETHAMINE 30 MG/ML IJ SOLN
INTRAMUSCULAR | Status: AC
  Filled 2020-09-06: qty 1

## 2020-09-06 MED ORDER — CHLORHEXIDINE GLUCONATE 0.12 % MT SOLN
OROMUCOSAL | Status: AC
  Filled 2020-09-06: qty 15

## 2020-09-06 MED ORDER — KETOROLAC TROMETHAMINE 30 MG/ML IJ SOLN: 30.0000 mg | Freq: Once | INTRAMUSCULAR | Status: DC | PRN

## 2020-09-06 MED ORDER — SODIUM CHLORIDE 0.9 % IR SOLN
Status: DC | PRN
  Administered 2020-09-06: 1000 mL

## 2020-09-06 MED ORDER — ONDANSETRON 4 MG PO TBDP
4.0000 mg | ORAL_TABLET | Freq: Four times a day (QID) | ORAL | Status: DC | PRN
Start: 2020-09-06 — End: 2020-09-06

## 2020-09-06 MED ORDER — PHENYLEPHRINE 40 MCG/ML (10ML) SYRINGE FOR IV PUSH (FOR BLOOD PRESSURE SUPPORT)
PREFILLED_SYRINGE | INTRAVENOUS | Status: AC
  Filled 2020-09-06: qty 10

## 2020-09-06 MED ORDER — HYDRALAZINE HCL 20 MG/ML IJ SOLN: 10.0000 mg | INTRAMUSCULAR | Status: DC | PRN

## 2020-09-06 MED ORDER — ONDANSETRON HCL 4 MG/2ML IJ SOLN
INTRAMUSCULAR | Status: DC | PRN
  Administered 2020-09-06: 4 mg via INTRAVENOUS

## 2020-09-06 MED ORDER — PROPOFOL 10 MG/ML IV BOLUS
INTRAVENOUS | Status: AC
  Filled 2020-09-06: qty 20

## 2020-09-06 MED ORDER — PHENYLEPHRINE 40 MCG/ML (10ML) SYRINGE FOR IV PUSH (FOR BLOOD PRESSURE SUPPORT)
PREFILLED_SYRINGE | INTRAVENOUS | Status: DC | PRN
  Administered 2020-09-06 (×3): 120 ug via INTRAVENOUS

## 2020-09-06 MED ORDER — SIMETHICONE 80 MG PO CHEW
40.0000 mg | CHEWABLE_TABLET | Freq: Four times a day (QID) | ORAL | Status: DC | PRN
Start: 2020-09-06 — End: 2020-09-06

## 2020-09-06 MED ORDER — FENTANYL CITRATE (PF) 100 MCG/2ML IJ SOLN
50.0000 ug | Freq: Once | INTRAMUSCULAR | Status: AC
  Administered 2020-09-06: 50 ug via INTRAVENOUS
  Filled 2020-09-06: qty 2

## 2020-09-06 MED ORDER — SODIUM CHLORIDE 0.9 % IV SOLN
INTRAVENOUS | Status: DC | PRN
  Administered 2020-09-06: 10 mL

## 2020-09-06 MED ORDER — FERROUS FUMARATE 324 (106 FE) MG PO TABS: 1.0000 | ORAL_TABLET | ORAL | Status: DC

## 2020-09-06 MED ORDER — HYDROMORPHONE HCL 1 MG/ML IJ SOLN: 0.5000 mg | INTRAMUSCULAR | Status: DC | PRN

## 2020-09-06 MED ORDER — OXYCODONE HCL 5 MG/5ML PO SOLN: 5.0000 mg | Freq: Once | ORAL | Status: AC | PRN

## 2020-09-06 MED ORDER — ASPIRIN EC 81 MG PO TBEC: 81.0000 mg | DELAYED_RELEASE_TABLET | Freq: Every day | ORAL | Status: DC

## 2020-09-06 MED ORDER — OXYCODONE HCL 5 MG PO TABS: 5.0000 mg | ORAL_TABLET | Freq: Once | ORAL | Status: AC | PRN

## 2020-09-06 MED ORDER — ROSUVASTATIN CALCIUM 20 MG PO TABS: 40.0000 mg | ORAL_TABLET | Freq: Every day | ORAL | Status: DC

## 2020-09-06 MED ORDER — KETOROLAC TROMETHAMINE 30 MG/ML IJ SOLN
INTRAMUSCULAR | Status: DC | PRN
  Administered 2020-09-06: 30 mg via INTRAVENOUS

## 2020-09-06 MED ORDER — BUPIVACAINE-EPINEPHRINE (PF) 0.25% -1:200000 IJ SOLN
INTRAMUSCULAR | Status: AC
  Filled 2020-09-06: qty 30

## 2020-09-06 MED ORDER — LIDOCAINE 2% (20 MG/ML) 5 ML SYRINGE
INTRAMUSCULAR | Status: DC | PRN
  Administered 2020-09-06: 60 mg via INTRAVENOUS

## 2020-09-06 MED ORDER — LIDOCAINE 2% (20 MG/ML) 5 ML SYRINGE
INTRAMUSCULAR | Status: AC
  Filled 2020-09-06: qty 5

## 2020-09-06 MED ORDER — FENTANYL CITRATE (PF) 250 MCG/5ML IJ SOLN
INTRAMUSCULAR | Status: AC
  Filled 2020-09-06: qty 5

## 2020-09-06 MED ORDER — FENTANYL CITRATE (PF) 250 MCG/5ML IJ SOLN
INTRAMUSCULAR | Status: DC | PRN
  Administered 2020-09-06: 150 ug via INTRAVENOUS
  Administered 2020-09-06: 50 ug via INTRAVENOUS

## 2020-09-06 MED ORDER — MIDAZOLAM HCL 5 MG/5ML IJ SOLN
INTRAMUSCULAR | Status: DC | PRN
  Administered 2020-09-06: 2 mg via INTRAVENOUS

## 2020-09-06 MED ORDER — ONDANSETRON HCL 4 MG/2ML IJ SOLN: 4.0000 mg | Freq: Four times a day (QID) | INTRAMUSCULAR | Status: DC | PRN

## 2020-09-06 MED ORDER — SUGAMMADEX SODIUM 200 MG/2ML IV SOLN
INTRAVENOUS | Status: DC | PRN
  Administered 2020-09-06: 200 mg via INTRAVENOUS

## 2020-09-06 SURGICAL SUPPLY — 46 items
BENZOIN TINCTURE PRP APPL 2/3 (GAUZE/BANDAGES/DRESSINGS) ×2 IMPLANT
BLADE CLIPPER SURG (BLADE) ×2 IMPLANT
CANISTER SUCT 3000ML PPV (MISCELLANEOUS) ×2 IMPLANT
CHLORAPREP W/TINT 26 (MISCELLANEOUS) ×2 IMPLANT
CLSR STERI-STRIP ANTIMIC 1/2X4 (GAUZE/BANDAGES/DRESSINGS) ×2 IMPLANT
COVER MAYO STAND STRL (DRAPES) ×2 IMPLANT
COVER SURGICAL LIGHT HANDLE (MISCELLANEOUS) ×2 IMPLANT
COVER WAND RF STERILE (DRAPES) ×2 IMPLANT
DRAPE C-ARM 42X120 X-RAY (DRAPES) ×2 IMPLANT
DRSG TEGADERM 2-3/8X2-3/4 SM (GAUZE/BANDAGES/DRESSINGS) ×6 IMPLANT
DRSG TEGADERM 4X4.75 (GAUZE/BANDAGES/DRESSINGS) ×2 IMPLANT
ELECT REM PT RETURN 9FT ADLT (ELECTROSURGICAL) ×2
ELECTRODE REM PT RTRN 9FT ADLT (ELECTROSURGICAL) ×1 IMPLANT
GAUZE 4X4 16PLY ~~LOC~~+RFID DBL (SPONGE) ×2 IMPLANT
GAUZE SPONGE 2X2 8PLY STRL LF (GAUZE/BANDAGES/DRESSINGS) ×1 IMPLANT
GLOVE SURG ENC MOIS LTX SZ7 (GLOVE) ×2 IMPLANT
GLOVE SURG UNDER POLY LF SZ7 (GLOVE) ×2 IMPLANT
GLOVE SURG UNDER POLY LF SZ7.5 (GLOVE) ×2 IMPLANT
GOWN STRL REUS W/ TWL LRG LVL3 (GOWN DISPOSABLE) ×3 IMPLANT
GOWN STRL REUS W/TWL LRG LVL3 (GOWN DISPOSABLE) ×6
IV CATH 14GX2 1/4 (CATHETERS) ×2 IMPLANT
KIT BASIN OR (CUSTOM PROCEDURE TRAY) ×2 IMPLANT
KIT TURNOVER KIT B (KITS) ×2 IMPLANT
NS IRRIG 1000ML POUR BTL (IV SOLUTION) ×2 IMPLANT
PAD ARMBOARD 7.5X6 YLW CONV (MISCELLANEOUS) ×2 IMPLANT
POUCH RETRIEVAL ECOSAC 10 (ENDOMECHANICALS) ×1 IMPLANT
POUCH RETRIEVAL ECOSAC 10MM (ENDOMECHANICALS) ×2
SCISSORS LAP 5X35 DISP (ENDOMECHANICALS) ×2 IMPLANT
SET CHOLANGIOGRAPH 5 50 .035 (SET/KITS/TRAYS/PACK) ×2 IMPLANT
SET CHOLANGIOGRAPHY FRANKLIN (SET/KITS/TRAYS/PACK) ×2 IMPLANT
SET IRRIG TUBING LAPAROSCOPIC (IRRIGATION / IRRIGATOR) ×2 IMPLANT
SET TUBE SMOKE EVAC HIGH FLOW (TUBING) ×2 IMPLANT
SLEEVE ENDOPATH XCEL 5M (ENDOMECHANICALS) ×2 IMPLANT
SPECIMEN JAR SMALL (MISCELLANEOUS) ×2 IMPLANT
SPONGE GAUZE 2X2 STER 10/PKG (GAUZE/BANDAGES/DRESSINGS) ×1
SPONGE T-LAP 18X18 ~~LOC~~+RFID (SPONGE) ×2 IMPLANT
STOPCOCK 4 WAY LG BORE MALE ST (IV SETS) ×2 IMPLANT
STRIP CLOSURE SKIN 1/2X4 (GAUZE/BANDAGES/DRESSINGS) ×2 IMPLANT
SUT MNCRL AB 4-0 PS2 18 (SUTURE) ×2 IMPLANT
TOWEL GREEN STERILE (TOWEL DISPOSABLE) ×2 IMPLANT
TOWEL GREEN STERILE FF (TOWEL DISPOSABLE) ×2 IMPLANT
TRAY LAPAROSCOPIC MC (CUSTOM PROCEDURE TRAY) ×2 IMPLANT
TROCAR XCEL BLUNT TIP 100MML (ENDOMECHANICALS) ×2 IMPLANT
TROCAR XCEL NON-BLD 11X100MML (ENDOMECHANICALS) ×2 IMPLANT
TROCAR XCEL NON-BLD 5MMX100MML (ENDOMECHANICALS) ×2 IMPLANT
WATER STERILE IRR 1000ML POUR (IV SOLUTION) ×2 IMPLANT

## 2020-09-06 NOTE — ED Notes (Signed)
Pt is going to short stay bay [facility].

## 2020-09-06 NOTE — Discharge Instructions (Signed)
[facility] ______[facility] LAPAROSCOPIC SURGERY: POST OP INSTRUCTIONS Always review your discharge instruction sheet given to you by the facility where your surgery was performed. IF YOU HAVE DISABILITY OR FAMILY LEAVE FORMS, YOU MUST BRING THEM TO THE OFFICE FOR PROCESSING.   DO NOT GIVE THEM TO YOUR DOCTOR.  A prescription for pain medication may be given to you upon discharge.  Take your pain medication as prescribed, if needed.  If narcotic pain medicine is not needed, then you may take acetaminophen (Tylenol) or ibuprofen (Advil) as needed. Take your usually prescribed medications unless otherwise directed. If you need a refill on your pain medication, please contact your pharmacy.  They will contact our office to request authorization. Prescriptions will not be filled after 5pm or on week-ends. You should follow a light diet the first few days after arrival home, such as soup and crackers, etc.  Be sure to include lots of fluids daily. Most patients will experience some swelling and bruising in the area of the incisions.  Ice packs will help.  Swelling and bruising can take several days to resolve.  It is common to experience some constipation if taking pain medication after surgery.  Increasing fluid intake and taking a stool softener (such as Colace) will usually help or prevent this problem from occurring.  A mild laxative (Milk of Magnesia or Miralax) should be taken according to package instructions if there are no bowel movements after 48 hours. Unless discharge instructions indicate otherwise, you may remove your bandages 24-48 hours after surgery, and you may shower at that time.  You may have steri-strips (small skin tapes) in place directly over the incision.  These strips should be left on the skin for 7-10 days.  If your surgeon used skin glue on the incision, you may shower in 24 hours.  The glue will flake off over the next 2-3 weeks.  Any sutures or staples will be  removed at the office during your follow-up visit. ACTIVITIES:  You may resume regular (light) daily activities beginning the next day--such as daily self-care, walking, climbing stairs--gradually increasing activities as tolerated.  You may have sexual intercourse when it is comfortable.  Refrain from any heavy lifting or straining until approved by your doctor. You may drive when you are no longer taking prescription pain medication, you can comfortably wear a seatbelt, and you can safely maneuver your car and apply brakes. RETURN TO WORK:  __________________________________________________________ You should see your doctor in the office for a follow-up appointment approximately 2-3 weeks after your surgery.  Make sure that you call for this appointment within a day or two after you arrive home to insure a convenient appointment time. OTHER INSTRUCTIONS: __________________________________________________________________________________________________________________________ __________________________________________________________________________________________________________________________ WHEN TO CALL YOUR DOCTOR: Fever over 101.0 Inability to urinate Continued bleeding from incision. Increased pain, redness, or drainage from the incision. Increasing abdominal pain  The clinic staff is available to answer your questions during regular business hours.  Please don't hesitate to call and ask to speak to one of the nurses for clinical concerns.  If you have a medical emergency, go to the nearest emergency room or call 911.  A surgeon from [facility] is always on call at the hospital. [address], [address], [place], [place]  [address] ? [address] [address], [place], [place]   [address] [phone] ? [phone] ? FAX [phone] Web site: [email or web address]  

## 2020-09-06 NOTE — Anesthesia Preprocedure Evaluation (Addendum)
Anesthesia Evaluation  Patient identified by MRN, date of birth, ID band Patient awake    Reviewed: Allergy & Precautions, NPO status , Patient's Chart, lab work & pertinent test results  Airway Mallampati: III  TM Distance: >3 FB Neck ROM: Full    Dental no notable dental hx.    Pulmonary sleep apnea , former smoker,    Pulmonary exam normal breath sounds clear to auscultation       Cardiovascular negative cardio ROS Normal cardiovascular exam Rhythm:Regular Rate:Normal  ECG: SR, rate 85   Neuro/Psych  Neuromuscular disease negative psych ROS   GI/Hepatic negative GI ROS, Neg liver ROS,   Endo/Other  negative endocrine ROS  Renal/GU negative Renal ROS     Musculoskeletal negative musculoskeletal ROS (+)   Abdominal (+) + obese,   Peds  Hematology HLD   Anesthesia Other Findings cholecystitis  Reproductive/Obstetrics                            Anesthesia Physical Anesthesia Plan  ASA: 2  Anesthesia Plan: General   Post-op Pain Management:    Induction: Intravenous  PONV Risk Score and Plan: 3 and Ondansetron, Dexamethasone, Midazolam and Treatment may vary due to age or medical condition  Airway Management Planned: Oral ETT  Additional Equipment:   Intra-op Plan:   Post-operative Plan: Extubation in OR  Informed Consent: I have reviewed the patients History and Physical, chart, labs and discussed the procedure including the risks, benefits and alternatives for the proposed anesthesia with the patient or authorized representative who has indicated his/her understanding and acceptance.     Dental advisory given  Plan Discussed with: CRNA  Anesthesia Plan Comments:        Anesthesia Quick Evaluation

## 2020-09-06 NOTE — Transfer of Care (Signed)
Immediate Anesthesia Transfer of Care Note  Patient: Erik Salas  Procedure(s) Performed: LAPAROSCOPIC CHOLECYSTECTOMY WITH INTRAOPERATIVE CHOLANGIOGRAM (Abdomen)  Patient Location: PACU  Anesthesia Type:General  Level of Consciousness: drowsy  Airway & Oxygen Therapy: Patient Spontanous Breathing and Patient connected to nasal cannula oxygen  Post-op Assessment: Report given to RN and Post -op Vital signs reviewed and stable  Post vital signs: Reviewed and stable  Last Vitals:  Vitals Value Taken Time  BP 157/87 09/06/20 1623  Temp 36.6 C 09/06/20 1623  Pulse 73 09/06/20 1630  Resp 12 09/06/20 1630  SpO2 100 % 09/06/20 1630  Vitals shown include unvalidated device data.  Last Pain:  Vitals:   09/06/20 1623  TempSrc:   PainSc: 0-No pain      Patients Stated Pain Goal: 3 (09/06/20 1348)  Complications: No notable events documented.

## 2020-09-06 NOTE — ED Provider Notes (Signed)
12:36 AM RN notified MD White of [facility] of patient's arrival.  He notes some pain and nausea at this time. Requesting medications for symptom control which have been ordered. Pending admission to the surgical service for management of acute cholecystitis.  2:40 AM Patient resting comfortably. [facility] has been to bedside to assess.   Antony Madura, PA-C 09/06/20 3729    Geoffery Lyons, MD 09/06/20 [phone]

## 2020-09-06 NOTE — Anesthesia Procedure Notes (Signed)
Procedure Name: Intubation Date/Time: 09/06/2020 3:04 PM Performed by: Elliot Dally, CRNA Pre-anesthesia Checklist: Patient identified, Emergency Drugs available, Suction available and Patient being monitored Patient Re-evaluated:Patient Re-evaluated prior to induction Oxygen Delivery Method: Circle System Utilized Preoxygenation: Pre-oxygenation with 100% oxygen Induction Type: IV induction Ventilation: Mask ventilation without difficulty and Oral airway inserted - appropriate to patient size Laryngoscope Size: Hyacinth Meeker and 2 Grade View: Grade I Tube type: Oral Tube size: 7.5 mm Number of attempts: 1 Airway Equipment and Method: Stylet and Oral airway Placement Confirmation: ETT inserted through vocal cords under direct vision, positive ETCO2 and breath sounds checked- equal and bilateral Secured at: 22 cm Tube secured with: Tape Dental Injury: Teeth and Oropharynx as per pre-operative assessment

## 2020-09-06 NOTE — ED Notes (Signed)
IV to RFA 20g infiltrated with mild swelling noted. Pt denies pain or discomfort. No redness or warmth noted. IV removed and ice pack and warm pack applied interchangeably. Will continue to monitor.

## 2020-09-06 NOTE — Op Note (Signed)
Laparoscopic Cholecystectomy with IOC Procedure Note  Indications: This patient presents with acute cholecystitis due to gallstones.  His total bilirubin is mildly elevated so we will perform cholangiogram.  He presents now for cholecystectomy.  Pre-operative Diagnosis: Calculus of gallbladder with acute cholecystitis, without mention of obstruction  Post-operative Diagnosis: Same  Surgeon: Wynona Luna   Assistants: Alfonso Patten, MD, PGY3  Anesthesia: General endotracheal anesthesia  ASA Class: 2  Procedure Details  The patient was seen again in the Holding Room. The risks, benefits, complications, treatment options, and expected outcomes were discussed with the patient. The possibilities of reaction to medication, pulmonary aspiration, perforation of viscus, bleeding, recurrent infection, finding a normal gallbladder, the need for additional procedures, failure to diagnose a condition, the possible need to convert to an open procedure, and creating a complication requiring transfusion or operation were discussed with the patient. The likelihood of improving the patient's symptoms with return to their baseline status is good.  The patient and/or family concurred with the proposed plan, giving informed consent. The site of surgery properly noted. The patient was taken to Operating Room, identified as Annamarie Dawley and the procedure verified as Laparoscopic Cholecystectomy with Intraoperative Cholangiogram. A Time Out was held and the above information confirmed.  Prior to the induction of general anesthesia, antibiotic prophylaxis was administered. General endotracheal anesthesia was then administered and tolerated well. After the induction, the abdomen was prepped with Chloraprep and draped in the sterile fashion. The patient was positioned in the supine position.  Local anesthetic agent was injected into the skin above the umbilicus and an incision made. We dissected down to the abdominal  fascia with blunt dissection.  The fascia was incised vertically and we entered the peritoneal cavity bluntly.  A pursestring suture of 0-Vicryl was placed around the fascial opening.  The Hasson cannula was inserted and secured with the stay suture.  Pneumoperitoneum was then created with CO2 and tolerated well without any adverse changes in the patient's vital signs. An 11-mm port was placed in the subxiphoid position.  Two 5-mm ports were placed in the right upper quadrant. All skin incisions were infiltrated with a local anesthetic agent before making the incision and placing the trocars.   We positioned the patient in reverse Trendelenburg, tilted slightly to the patient's left.  The gallbladder was identified, the fundus grasped and retracted cephalad. The gallbladder wall is edematous and thickened.  Adhesions were lysed bluntly and with the electrocautery where indicated, taking care not to injure any adjacent organs or viscus. The infundibulum was grasped and retracted laterally, exposing the peritoneum overlying the triangle of Calot. This was then divided and exposed in a blunt fashion. A critical view of the cystic duct and cystic artery was obtained.  The cystic duct was clearly identified and bluntly dissected circumferentially. The cystic duct was ligated with a clip distally.   An incision was made in the cystic duct and the [facility] cholangiogram catheter introduced. The catheter was secured using a clip. A cholangiogram was then obtained which showed good visualization of the distal and proximal biliary tree with no sign of filling defects or obstruction.  The cystic duct is quite long and plugs into the left side of the common bile duct.  Contrast flowed easily into the duodenum. The catheter was then removed.   The cystic duct was then ligated with clips and divided. The cystic artery was identified, dissected free, ligated with clips and divided as well.   The gallbladder was  dissected from  the liver bed in retrograde fashion with the electrocautery. The gallbladder was removed and placed in an Endocatch sac. The liver bed was irrigated and inspected. Hemostasis was achieved with the electrocautery. Copious irrigation was utilized and was repeatedly aspirated until clear.  The gallbladder and Endocatch sac were then removed through the umbilical port site.  The pursestring suture was used to close the umbilical fascia.    We again inspected the right upper quadrant for hemostasis.  Pneumoperitoneum was released as we removed the trocars.  4-0 Monocryl was used to close the skin.   Benzoin, steri-strips, and clean dressings were applied. The patient was then extubated and brought to the recovery room in stable condition. Instrument, sponge, and needle counts were correct at closure and at the conclusion of the case.   Findings: Acute Cholecystitis with Cholelithiasis  Estimated Blood Loss: Minimal         Drains: none         Specimens: Gallbladder           Complications: None; patient tolerated the procedure well.         Disposition: PACU - hemodynamically stable.         Condition: stable

## 2020-09-06 NOTE — H&P (Signed)
CC: RUQ pain  Requesting provider: Dr. Audley Hose  HPI: Erik Salas is an 60 y.o. male hx GERD, HLD presented to [facility] with 1d hx of RUQ pain, severe sharp and non-radiating. Has never had anything like this before. Pain is persistent but comes in waves. No aggravating/alleviating factors. Associated nausea and some chills. No emesis, nor fever per se. He underwent workup at was then transferred here for surgical evaluation  PSH: Open appendectomy as a child Shx: Denies tob/etoh/drug use; does office based work. He is here with his wife, Clydie Braun, in ER  Past Medical History:  Diagnosis Date   GERD (gastroesophageal reflux disease)    History of ETT    cardiolite > 10 years ago was normal per patient's report   Hyperlipidemia    Low testosterone    OSA (obstructive sleep apnea)    Sciatica     Past Surgical History:  Procedure Laterality Date   APPENDECTOMY      Family History  Problem Relation Age of Onset   Heart attack Father 29   Heart attack Brother 49   Heart attack Sister 65    Social:  reports that he quit smoking about 34 years ago. He has never used smokeless tobacco. He reports current alcohol use. No history on file for drug use.  Allergies: No Known Allergies  Medications: I have reviewed the patient's current medications.  Results for orders placed or performed during the hospital encounter of 09/05/20 (from the past 48 hour(s))  Lipase, blood     Status: None   Collection Time: 09/05/20  8:21 PM  Result Value Ref Range   Lipase 36 11 - 51 U/L    Comment: Performed at [facility], [address] [place], [place] [address]  Comprehensive metabolic panel     Status: Abnormal   Collection Time: 09/05/20  8:21 PM  Result Value Ref Range   Sodium 139 135 - 145 mmol/L   Potassium 3.8 3.5 - 5.1 mmol/L   Chloride 101 98 - 111 mmol/L   CO2 27 22 - 32 mmol/L   Glucose, Bld 125 (H) 70 - 99 mg/dL    Comment: Glucose reference range applies only  to samples taken after fasting for at least 8 hours.   BUN 20 8 - 23 mg/dL   Creatinine, Ser 6.04 0.61 - 1.24 mg/dL   Calcium 54.0 8.9 - 98.1 mg/dL   Total Protein 7.0 6.5 - 8.1 g/dL   Albumin 4.2 3.5 - 5.0 g/dL   AST 191 (H) 15 - 41 U/L   ALT 75 (H) 0 - 44 U/L   Alkaline Phosphatase 106 38 - 126 U/L   Total Bilirubin 1.4 (H) 0.3 - 1.2 mg/dL   GFR, Estimated >47 >82 mL/min    Comment: (NOTE) Calculated using the CKD-EPI Creatinine Equation (2021)    Anion gap 11 5 - 15    Comment: Performed at [facility], [address] [place], [place], [place] [address]  CBC     Status: Abnormal   Collection Time: 09/05/20  8:21 PM  Result Value Ref Range   WBC 10.7 (H) 4.0 - 10.5 K/uL   RBC 4.90 4.22 - 5.81 MIL/uL   Hemoglobin 14.7 13.0 - 17.0 g/dL   HCT 30.8 65.7 - 84.6 %   MCV 90.8 80.0 - 100.0 fL   MCH 30.0 26.0 - 34.0 pg   MCHC 33.0 30.0 - 36.0 g/dL   RDW 96.2 95.2 - 84.1 %  Platelets 147 (L) 150 - 400 K/uL   nRBC 0.0 0.0 - 0.2 %    Comment: Performed at [facility], [address], [place], [place] [address]  Urinalysis, Routine w reflex microscopic Urine, Clean Catch     Status: None   Collection Time: 09/05/20  8:24 PM  Result Value Ref Range   Color, Urine YELLOW YELLOW   APPearance CLEAR CLEAR   Specific Gravity, Urine 1.024 1.005 - 1.030   pH 6.0 5.0 - 8.0   Glucose, UA NEGATIVE NEGATIVE mg/dL   Hgb urine dipstick NEGATIVE NEGATIVE   Bilirubin Urine NEGATIVE NEGATIVE   Ketones, ur NEGATIVE NEGATIVE mg/dL   Protein, ur NEGATIVE NEGATIVE mg/dL   Nitrite NEGATIVE NEGATIVE   Leukocytes,Ua NEGATIVE NEGATIVE    Comment: Performed at [facility], [address], [place], [place] [address]  Resp Panel by RT-PCR (Flu A&B, Covid) Nasopharyngeal Swab     Status: None   Collection Time: 09/05/20 10:48 PM   Specimen: Nasopharyngeal Swab; Nasopharyngeal(NP) swabs in vial transport medium  Result Value Ref Range   SARS Coronavirus  2 by RT PCR NEGATIVE NEGATIVE    Comment: (NOTE) SARS-CoV-2 target nucleic acids are NOT DETECTED.  The SARS-CoV-2 RNA is generally detectable in upper respiratory specimens during the acute phase of infection. The lowest concentration of SARS-CoV-2 viral copies this assay can detect is 138 copies/mL. A negative result does not preclude SARS-Cov-2 infection and should not be used as the sole basis for treatment or other patient management decisions. A negative result may occur with  improper specimen collection/handling, submission of specimen other than nasopharyngeal swab, presence of viral mutation(s) within the areas targeted by this assay, and inadequate number of viral copies(<138 copies/mL). A negative result must be combined with clinical observations, patient history, and epidemiological information. The expected result is Negative.  Fact Sheet for Patients:  BloggerCourse.comhttps://www.fda.gov/media/152166/download  Fact Sheet for Healthcare Providers:  SeriousBroker.ithttps://www.fda.gov/media/152162/download  This test is no t yet approved or cleared by the [place] FDA and  has been authorized for detection and/or diagnosis of SARS-CoV-2 by FDA under an Emergency Use Authorization (EUA). This EUA will remain  in effect (meaning this test can be used) for the duration of the COVID-19 declaration under Section 564(b)(1) of the Act, 21 U.S.C.section 360bbb-3(b)(1), unless the authorization is terminated  or revoked sooner.       Influenza A by PCR NEGATIVE NEGATIVE   Influenza B by PCR NEGATIVE NEGATIVE    Comment: (NOTE) The Xpert Xpress SARS-CoV-2/FLU/RSV plus assay is intended as an aid in the diagnosis of influenza from Nasopharyngeal swab specimens and should not be used as a sole basis for treatment. Nasal washings and aspirates are unacceptable for Xpert Xpress SARS-CoV-2/FLU/RSV testing.  Fact Sheet for Patients: BloggerCourse.comhttps://www.fda.gov/media/152166/download  Fact Sheet for Healthcare  Providers: SeriousBroker.ithttps://www.fda.gov/media/152162/download  This test is not yet approved or cleared by the [place] FDA and has been authorized for detection and/or diagnosis of SARS-CoV-2 by FDA under an Emergency Use Authorization (EUA). This EUA will remain in effect (meaning this test can be used) for the duration of the COVID-19 declaration under Section 564(b)(1) of the Act, 21 U.S.C. section 360bbb-3(b)(1), unless the authorization is terminated or revoked.  Performed at [facility], [address], [place], [place] [address]     US Abdomen Limited RUQ (LIVER/GB)  Result Date: 09/05/2020 CLINICAL DATA:  Right upper quadrant pain EXAM: ULTRASOUND ABDOMEN LIMITED RIGHT UPPER QUADRANT COMPARISON:  None. FINDINGS: Gallbladder: Sludge and  small stones within the gallbladder. Slight increased wall thickness at 5 mm. Positive sonographic Murphy. Common bile duct: Diameter: 6 mm Liver: No focal lesion identified. Within normal limits in parenchymal echogenicity. Portal vein is patent on color Doppler imaging with normal direction of blood flow towards the liver. Other: None. IMPRESSION: 1. Sludge and small stones in the gallbladder with increased wall thickness and positive sonographic Eulah Pont, collective findings are suspicious for an acute cholecystitis. 2. Common duct diameter upper limits of normal Electronically Signed   By: Jasmine Pang M.D.   On: 09/05/2020 22:21    ROS - all of the below systems have been reviewed with the patient and positives are indicated with bold text General: chills, fever or night sweats Eyes: blurry vision or double vision ENT: epistaxis or sore throat Allergy/Immunology: itchy/watery eyes or nasal congestion Hematologic/Lymphatic: bleeding problems, blood clots or swollen lymph nodes Endocrine: temperature intolerance or unexpected weight changes Breast: new or changing breast lumps or nipple discharge Resp: cough, shortness of breath,  or wheezing CV: chest pain or dyspnea on exertion GI: as per HPI GU: dysuria, trouble voiding, or hematuria MSK: joint pain or joint stiffness Neuro: TIA or stroke symptoms Derm: pruritus and skin lesion changes Psych: anxiety and depression  PE Blood pressure 132/90, pulse 81, temperature 98.1 F (36.7 C), temperature source Oral, resp. rate 18, height 5\' 9"  (1.753 m), weight 94.3 kg, SpO2 98 %. Constitutional: NAD; conversant; no deformities Eyes: Moist conjunctiva; no lid lag; anicteric; PERRL Neck: Trachea midline; no thyromegaly Lungs: Normal respiratory effort; no tactile fremitus CV: RRR; no palpable thrills; no pitting edema GI: Abd soft, mildly ttp in RUQ; no rebound nor guarding; nondistended; no palpable hepatosplenomegaly MSK: Normal range of motion of extremities; no clubbing/cyanosis Psychiatric: Appropriate affect; alert and oriented x3 Lymphatic: No palpable cervical or axillary lymphadenopathy  Results for orders placed or performed during the hospital encounter of 09/05/20 (from the past 48 hour(s))  Lipase, blood     Status: None   Collection Time: 09/05/20  8:21 PM  Result Value Ref Range   Lipase 36 11 - 51 U/L    Comment: Performed at [facility], [address] [place], [place]  Comprehensive metabolic panel     Status: Abnormal   Collection Time: 09/05/20  8:21 PM  Result Value Ref Range   Sodium 139 135 - 145 mmol/L   Potassium 3.8 3.5 - 5.1 mmol/L   Chloride 101 98 - 111 mmol/L   CO2 27 22 - 32 mmol/L   Glucose, Bld 125 (H) 70 - 99 mg/dL    Comment: Glucose reference range applies only to samples taken after fasting for at least 8 hours.   BUN 20 8 - 23 mg/dL   Creatinine, Ser 09/07/20 0.61 - 1.24 mg/dL   Calcium 6.80 8.9 - 32.1 mg/dL   Total Protein 7.0 6.5 - 8.1 g/dL   Albumin 4.2 3.5 - 5.0 g/dL   AST 22.4 (H) 15 - 41 U/L   ALT 75 (H) 0 - 44 U/L   Alkaline Phosphatase 106 38 - 126 U/L   Total Bilirubin 1.4 (H) 0.3 -  1.2 mg/dL   GFR, Estimated 825 >00 mL/min    Comment: (NOTE) Calculated using the CKD-EPI Creatinine Equation (2021)    Anion gap 11 5 - 15    Comment: Performed at [facility], [address], [place], [place]  CBC     Status: Abnormal   Collection Time: 09/05/20  8:21 PM  Result Value Ref Range   WBC 10.7 (H) 4.0 - 10.5 K/uL   RBC 4.90 4.22 - 5.81 MIL/uL   Hemoglobin 14.7 13.0 - 17.0 g/dL   HCT 25.3 66.4 - 40.3 %   MCV 90.8 80.0 - 100.0 fL   MCH 30.0 26.0 - 34.0 pg   MCHC 33.0 30.0 - 36.0 g/dL   RDW 47.4 25.9 - 56.3 %   Platelets 147 (L) 150 - 400 K/uL   nRBC 0.0 0.0 - 0.2 %    Comment: Performed at [facility], [address], [place], [place] [address]  Urinalysis, Routine w reflex microscopic Urine, Clean Catch     Status: None   Collection Time: 09/05/20  8:24 PM  Result Value Ref Range   Color, Urine YELLOW YELLOW   APPearance CLEAR CLEAR   Specific Gravity, Urine 1.024 1.005 - 1.030   pH 6.0 5.0 - 8.0   Glucose, UA NEGATIVE NEGATIVE mg/dL   Hgb urine dipstick NEGATIVE NEGATIVE   Bilirubin Urine NEGATIVE NEGATIVE   Ketones, ur NEGATIVE NEGATIVE mg/dL   Protein, ur NEGATIVE NEGATIVE mg/dL   Nitrite NEGATIVE NEGATIVE   Leukocytes,Ua NEGATIVE NEGATIVE    Comment: Performed at [facility], [address] [place], [place] [address]  Resp Panel by RT-PCR (Flu A&B, Covid) Nasopharyngeal Swab     Status: None   Collection Time: 09/05/20 10:48 PM   Specimen: Nasopharyngeal Swab; Nasopharyngeal(NP) swabs in vial transport medium  Result Value Ref Range   SARS Coronavirus 2 by RT PCR NEGATIVE NEGATIVE    Comment: (NOTE) SARS-CoV-2 target nucleic acids are NOT DETECTED.  The SARS-CoV-2 RNA is generally detectable in upper respiratory specimens during the acute phase of infection. The lowest concentration of SARS-CoV-2 viral copies this assay can detect is 138 copies/mL. A negative result does not  preclude SARS-Cov-2 infection and should not be used as the sole basis for treatment or other patient management decisions. A negative result may occur with  improper specimen collection/handling, submission of specimen other than nasopharyngeal swab, presence of viral mutation(s) within the areas targeted by this assay, and inadequate number of viral copies(<138 copies/mL). A negative result must be combined with clinical observations, patient history, and epidemiological information. The expected result is Negative.  Fact Sheet for Patients:  BloggerCourse.com  Fact Sheet for Healthcare Providers:  SeriousBroker.it  This test is no t yet approved or cleared by the [place] FDA and  has been authorized for detection and/or diagnosis of SARS-CoV-2 by FDA under an Emergency Use Authorization (EUA). This EUA will remain  in effect (meaning this test can be used) for the duration of the COVID-19 declaration under Section 564(b)(1) of the Act, 21 U.S.C.section 360bbb-3(b)(1), unless the authorization is terminated  or revoked sooner.       Influenza A by PCR NEGATIVE NEGATIVE   Influenza B by PCR NEGATIVE NEGATIVE    Comment: (NOTE) The Xpert Xpress SARS-CoV-2/FLU/RSV plus assay is intended as an aid in the diagnosis of influenza from Nasopharyngeal swab specimens and should not be used as a sole basis for treatment. Nasal washings and aspirates are unacceptable for Xpert Xpress SARS-CoV-2/FLU/RSV testing.  Fact Sheet for Patients: BloggerCourse.com  Fact Sheet for Healthcare Providers: SeriousBroker.it  This test is not yet approved or cleared by the [place] FDA and has been authorized for detection and/or diagnosis of SARS-CoV-2 by FDA under an Emergency Use Authorization (EUA). This EUA will remain in effect (meaning this test can  be used) for the duration of  the COVID-19 declaration under Section 564(b)(1) of the Act, 21 U.S.C. section 360bbb-3(b)(1), unless the authorization is terminated or revoked.  Performed at [facility], [address], [place], [place] [address]     US Abdomen Limited RUQ (LIVER/GB)  Result Date: 09/05/2020 CLINICAL DATA:  Right upper quadrant pain EXAM: ULTRASOUND ABDOMEN LIMITED RIGHT UPPER QUADRANT COMPARISON:  None. FINDINGS: Gallbladder: Sludge and small stones within the gallbladder. Slight increased wall thickness at 5 mm. Positive sonographic Murphy. Common bile duct: Diameter: 6 mm Liver: No focal lesion identified. Within normal limits in parenchymal echogenicity. Portal vein is patent on color Doppler imaging with normal direction of blood flow towards the liver. Other: None. IMPRESSION: 1. Sludge and small stones in the gallbladder with increased wall thickness and positive sonographic Eulah Pont, collective findings are suspicious for an acute cholecystitis. 2. Common duct diameter upper limits of normal Electronically Signed   By: Jasmine Pang M.D.   On: 09/05/2020 22:21    *We have personally reviewed his ultrasound study    A/P: LAYSON BERTSCH is an 60 y.o. male with HTN, HLD - here with acute cholecystitis  -Admit to ward -NPO, MIVF, IV abx -Repeat LFTs this morning - timing of surgery based on these results -The anatomy and physiology of the hepatobiliary system was discussed with the patient. The pathophysiology of gallbladder disease was discussed as well. -The options for treatment were discussed including ongoing observation which may result in subsequent gallbladder complications (infection, pancreatitis, choledocholithiasis, etc) as well as surgery - laparoscopic cholecystectomy with possible intraoperative cholangiogram. -The planned procedures, material risks (including, but not limited to, pain, bleeding, infection, scarring, need for blood transfusion, damage to surrounding  structures- blood vessels/nerves/viscus/organs, damage to bile duct, bile leak, need for additional procedures, hernia, worsening of pre-existing medical conditions, pancreatitis, pneumonia, heart attack, stroke, death) benefits and alternatives to surgery were discussed. I noted a good probability that the procedure would help improve his symptoms. The patient's questions were answered to his satisfaction, he voiced understanding and elected to proceed with surgery. Additionally, we discussed typical postoperative expectations and the recovery process.  Marin Olp, MD [facility] Use [email or web address] to contact on call provider

## 2020-09-06 NOTE — Anesthesia Postprocedure Evaluation (Signed)
Anesthesia Post Note  Patient: Erik Salas  Procedure(s) Performed: LAPAROSCOPIC CHOLECYSTECTOMY WITH INTRAOPERATIVE CHOLANGIOGRAM (Abdomen)     Patient location during evaluation: PACU Anesthesia Type: General Level of consciousness: awake Pain management: pain level controlled Vital Signs Assessment: post-procedure vital signs reviewed and stable Respiratory status: spontaneous breathing, nonlabored ventilation, respiratory function stable and patient connected to nasal cannula oxygen Cardiovascular status: blood pressure returned to baseline and stable Postop Assessment: no apparent nausea or vomiting Anesthetic complications: no   No notable events documented.  Last Vitals:  Vitals:   09/06/20 1653 09/06/20 1708  BP: (!) 154/80 (!) 152/87  Pulse: 76 82  Resp: 15 13  Temp:  36.8 C  SpO2: 100% 96%    Last Pain:  Vitals:   09/06/20 1708  TempSrc:   PainSc: 4                  Cici Rodriges P [name]

## 2020-09-06 NOTE — ED Notes (Signed)
Patient arrives with [facility] from [facility] for surgical consult for gallbladder issues.

## 2020-09-07 ENCOUNTER — Encounter (HOSPITAL_COMMUNITY): Payer: Self-pay | Admitting: Surgery

## 2020-09-08 ENCOUNTER — Encounter (HOSPITAL_COMMUNITY): Payer: Self-pay | Admitting: Surgery

## 2020-09-08 LAB — SURGICAL PATHOLOGY

## 2020-09-21 ENCOUNTER — Ambulatory Visit: Payer: Self-pay | Admitting: Internal Medicine

## 2020-09-22 NOTE — Progress Notes (Signed)
Assessment and Plan:  There are no diagnoses linked to this encounter.  Jovonte was seen today for hand pain.  Diagnoses and all orders for this visit:  Finger pain, right -     Uric acid -     DG Finger Little Right; Future       - Pt instructed to use antiinflammatory med such as Advil 400-600 mg q 8 hours as needed for pain as well as ice or heat whichever feels better.  Will treat further depending on results  Shingrix Vaccination       -  Pt was requesting prescription to take with him to [facility] to get his vaccination, script provided.   Further disposition pending results of labs. Discussed med's effects and SE's.   Over 30 minutes of exam, counseling, chart review, and critical decision making was performed.   Future Appointments  Date Time Provider Department Center  12/15/2020 11:00 AM Lucky Cowboy, MD [facility] None    ------------------------------------------------------------------------------------------------------------------   HPI BP 118/88   Pulse 85   Temp (!) 97.5 F (36.4 C)   Wt 216 lb 6.4 oz (98.2 kg)   SpO2 98%   BMI 31.96 kg/m  59 y.o.male presents for pain in right little finger which has been present for a year, mobility decreasing and pain has increased.  Can not remember injuring the joint.  Tried Tylenol which relieved pain a little bit but pain returns.  When at rest- feels ache pain but with movement is a 10/10 of sharp shooting pain.    Past Medical History:  Diagnosis Date   GERD (gastroesophageal reflux disease)    History of ETT    cardiolite > 10 years ago was normal per patient's report   Hyperlipidemia    Low testosterone    OSA (obstructive sleep apnea)    DOES NOT USE CPAP   Sciatica      No Known Allergies  Current Outpatient Medications on File Prior to Visit  Medication Sig   aspirin 81 MG EC tablet Take 81 mg by mouth at bedtime.   B Complex Vitamins (VITAMIN B COMPLEX PO) Take by mouth daily.   Calcium  Carbonate-Vit D-Min (CALCIUM 1200 PO) Take by mouth daily.   Cholecalciferol (VITAMIN D PO) Take 10,000 Units by mouth daily.   Coenzyme Q10 (CO Q 10 PO) Take 1 capsule by mouth daily.   ferrous fumarate (HEMOCYTE - 106 MG FE) 325 (106 FE) MG TABS tablet Take 1 tablet by mouth once a week.   Multiple Vitamin (MULTIVITAMIN) tablet Take 1 tablet by mouth daily.   OVER THE COUNTER MEDICATION [employer] Pro[facility]ate [facility]   rosuvastatin (CRESTOR) 40 MG tablet Take      1 tablet      Daily       for Cholesterol (Patient taking differently: every other day. Take      1 tablet        for Cholesterol)   sildenafil (VIAGRA) 100 MG tablet Take       1/2 to 1 tablet         Daily         as needed for XXXX   No current facility-administered medications on file prior to visit.    ROS: all negative except above.   Physical Exam:  BP 118/88   Pulse 85   Temp (!) 97.5 F (36.4 C)   Wt 216 lb 6.4 oz (98.2 kg)   SpO2 98%   BMI 31.96 kg/m  General Appearance: Well nourished, in no apparent distress. Eyes: PERRLA, EOMs, conjunctiva no swelling or erythema Sinuses: No Frontal/maxillary tenderness ENT/Mouth: Ext aud canals clear, TMs without erythema, bulging. No erythema, swelling, or exudate on post pharynx.  Tonsils not swollen or erythematous. Hearing normal.  Neck: Supple, thyroid normal.  Respiratory: Respiratory effort normal, BS equal bilaterally without rales, rhonchi, wheezing or stridor.  Cardio: RRR with no MRGs. Brisk peripheral pulses without edema.  Abdomen: Soft, + BS.  Non tender, no guarding, rebound, hernias, masses. Lymphatics: Non tender without lymphadenopathy.  Musculoskeletal: Right little finger is swollen and has a little ecchymosis, warm to touch. Painful with bending of finger. Skin: Warm, dry without rashes, lesions, ecchymosis.  Neuro: Cranial nerves intact. Normal muscle tone, no cerebellar symptoms. Sensation intact.  Psych: Awake and oriented X 3, normal affect,  Insight and Judgment appropriate.     Revonda Humphrey, NP 12:28 PM [facility]

## 2020-09-23 ENCOUNTER — Encounter: Payer: Self-pay | Admitting: Nurse Practitioner

## 2020-09-23 ENCOUNTER — Ambulatory Visit
Admission: RE | Admit: 2020-09-23 | Discharge: 2020-09-23 | Disposition: A | Discharge status: Home / Self Care | Payer: No Typology Code available for payment source | Source: Ambulatory Visit | Attending: Nurse Practitioner | Admitting: Nurse Practitioner

## 2020-09-23 ENCOUNTER — Other Ambulatory Visit: Payer: Self-pay

## 2020-09-23 ENCOUNTER — Ambulatory Visit (INDEPENDENT_AMBULATORY_CARE_PROVIDER_SITE_OTHER): Payer: No Typology Code available for payment source | Admitting: Nurse Practitioner

## 2020-09-23 VITALS — BP 118/88 | HR 85 | Temp 97.5°F | Wt 216.4 lb

## 2020-09-23 DIAGNOSIS — M79644 Pain in right finger(s): Secondary | ICD-10-CM | POA: Diagnosis not present

## 2020-09-23 NOTE — Patient Instructions (Signed)
I have ordered the xray at [address]- [facility] .  The order is there and you can walk in at any time  Musculoskeletal Pain Musculoskeletal pain refers to aches and pains in your bones, joints, muscles, and the tissues that surround them. This pain can occur in any part of the body. It can last for a short time (acute) or a long time (chronic). A physical exam, lab tests, and imaging studies may be done to find the causeof your musculoskeletal pain. Follow these instructions at home: Lifestyle Try to control or lower your stress levels. Stress increases muscle tension and can worsen musculoskeletal pain. It is important to recognize when you are anxious or stressed and learn ways to manage it. This may include: Meditation or yoga. Cognitive or behavioral therapy. Acupuncture or massage therapy. You may continue all activities unless the activities cause more pain. When the pain gets better, slowly resume your normal activities. Gradually increase the intensity and duration of your activities or exercise. Managing pain, stiffness, and swelling     Treatment may include medicines for pain and inflammation that are taken by mouth or applied to the skin. Take over-the-counter and prescription medicines only as told by your health care provider. When your pain is severe, bed rest may be helpful. Lie or sit in any position that is comfortable, but get out of bed and walk around at least every couple of hours. If directed, apply heat to the affected area as often as told by your health care provider. Use the heat source that your health care provider recommends, such as a moist heat pack or a heating pad. Place a towel between your skin and the heat source. Leave the heat on for 20-30 minutes. Remove the heat if your skin turns bright red. This is especially important if you are unable to feel pain, heat, or cold. You may have a greater risk of getting burned. If directed, put ice on  the painful area. To do this: Put ice in a plastic bag. Place a towel between your skin and the bag. Leave the ice on for 20 minutes, 2-3 times a day. Remove the ice if your skin turns bright red. This is very important. If you cannot feel pain, heat, or cold, you have a greater risk of damage to the area. General instructions Your health care provider may recommend that you see a physical therapist. This person can help you come up with a safe exercise program. If told by your health care provider, do physical therapy exercises to improve movement and strength in the affected area. Keep all follow-up visits. This is important. This includes any physical therapy visits. Contact a health care provider if: Your pain gets worse. Medicines do not help ease your pain. You cannot use the part of your body that hurts, such as your arm, leg, or neck. You have trouble sleeping. You have trouble doing your normal activities. Get help right away if: You have a new injury and your pain is worse or different. You feel numb or you have tingling in the painful area. Summary Musculoskeletal pain refers to aches and pains in your bones, joints, muscles, and the tissues that surround them. This pain can occur in any part of the body. Your health care provider may recommend that you see a physical therapist. This person can help you come up with a safe exercise program. Do any exercises as told by your physical therapist. Lower your stress level. Stress  can worsen musculoskeletal pain. Ways to lower stress may include meditation, yoga, cognitive or behavioral therapy, acupuncture, and massage therapy. This information is not intended to replace advice given to you by your health care provider. Make sure you discuss any questions you have with your healthcare provider. Document Revised: 07/02/2019 Document Reviewed: 06/10/2019 Elsevier Patient Education  2022 [employer].

## 2020-09-24 LAB — URIC ACID: Uric Acid, Serum: 5.8 mg/dL (ref 4.0–8.0)

## 2020-10-04 ENCOUNTER — Other Ambulatory Visit: Payer: Self-pay | Admitting: Internal Medicine

## 2020-10-04 DIAGNOSIS — E782 Mixed hyperlipidemia: Secondary | ICD-10-CM

## 2020-12-14 ENCOUNTER — Encounter: Payer: Self-pay | Admitting: Internal Medicine

## 2020-12-14 NOTE — Patient Instructions (Signed)

## 2020-12-14 NOTE — Progress Notes (Signed)
Annual  Screening/Preventative Visit  & Comprehensive Evaluation & Examination  Future Appointments  Date Time Provider Department Center  12/15/2020 11:00 AM Lucky Cowboy, MD [facility] None            This very nice 60 y.o. MWM presents for a Screening /Preventative Visit & comprehensive evaluation and management of multiple medical co-morbidities.  Patient has been followed for HTN, HLD, Prediabetes and Vitamin D Deficiency. Patient had hx/o OSA cured after a 35 # weight loss. Patient has hx/o GERD controlled on diet;       Labile HTN followed expectantly predates since 2013. Patient's BP has been controlled at home.  Today's BP is at goal - 130/78. Patient denies any cardiac symptoms as chest pain, palpitations, shortness of breath, dizziness or ankle swelling.       Patient's hyperlipidemia is controlled with diet and Rosuvastatin. Patient denies myalgias or other medication SE's. Last lipids were at goal:  Lab Results  Component Value Date   CHOL 147 12/14/2019   HDL 60 12/14/2019   LDLCALC 64 12/14/2019   TRIG 147 12/14/2019   CHOLHDL 2.5 12/14/2019                                                      Patient has hx/o Testosterone Deficiency ("220" /2012) and declined treatment.         Patient has hx/o prediabetes (A1c 5.7% /2009) and patient denies reactive hypoglycemic symptoms, visual blurring, diabetic polys or paresthesias. Last A1c was normal  & at goal:  Lab Results  Component Value Date   HGBA1C 5.5 12/14/2019          Finally, patient has history of Vitamin D Deficiency ("39" /2016) and last vitamin D was low (goal 70-100):   Lab Results  Component Value Date   VD25OH 48 12/14/2019    Current Outpatient Medications on File Prior to Visit  Medication Sig   aspirin 81 MG EC tablet Take  at bedtime.   VITAMIN B COMPLEX PO) Take daily.   Calcium -Vit D-Min (1200 mg Take  daily.   VITAMIN D  10,000 Units Take   daily.   Coenzyme Q10  Take 1 capsule  daily.   HEMOCYTE - 106 MG FE  MG  Take 1 tablet once a week.   Multiple Vitamin  Take 1 tablet daily.   Herbal Life Prostrate Health    rosuvastatin 40 MG tablet TAKE 1 TABLET  DAILY FOR C   sildenafil 100 MG tablet Take 1/2 to 1 tablet Daily as needed    No Known Allergies   Past Medical History:  Diagnosis Date   GERD (gastroesophageal reflux disease)    History of ETT    cardiolite > 10 years ago was normal per patient's report   Hyperlipidemia    Low testosterone    OSA (obstructive sleep apnea)    DOES NOT USE CPAP   Sciatica     Health Maintenance  Topic Date Due   Zoster Vaccines- Shingrix (1 of 2) Never done   FOOT EXAM  10/28/2014   COLONOSCOPY  06/28/2019   COVID-19 Vaccine (3 - Mixed Product risk series) 08/10/2019   HEMOGLOBIN A1C  06/13/2020   OPHTHALMOLOGY EXAM  07/29/2020   INFLUENZA VACCINE  10/10/2020   URINE MICROALBUMIN  12/13/2020   TETANUS/TDAP  11/11/2028   Hepatitis C Screening  Completed   HIV Screening  Completed   HPV VACCINES  Aged Out    Immunization History  Administered Date(s) Administered   Hepatitis B 09/02/2013, 10/01/2013, 01/20/2014   Influenza 01/03/2018   Moderna Sars-Covid-2 Vacc 06/22/2019, 07/13/2019   PPD Test 06/17/2017, 11/12/2018, 12/14/2019   Pneumococcal -23 01/12/2008, 06/17/2017   Tdap 02/10/2008, 11/12/2018    Last Colon - 06/2009 Dr Kinnie Scales   Past Surgical History:  Procedure Laterality Date   APPENDECTOMY     CHOLECYSTECTOMY [name] 09/06/2020   Procedure: LAPAROSCOPIC CHOLECYSTECTOMY WITH INTRAOPERATIVE CHOLANGIOGRAM;  Surgeon: Manus Rudd, MD;  Location: [facility] OR;  Service: General;  Laterality: [name];   LEG SURGERY Left    LOWER LEFT     Family History  Problem Relation Age of Onset   Heart attack Father 68   Heart attack Brother 71   Heart attack Sister 10     Social History   Occupation: Research officer, trade union for [employer]   Tobacco Use   Smoking status: Former    Types: Cigarettes    Quit  date: 03/12/1986    Years since quitting: 34.7   Smokeless tobacco: Never  Substance Use Topics   Alcohol use: Yes    Alcohol/week: 0.0 standard drinks    Comment: very rarely   Drug use: Never      ROS Constitutional: Denies fever, chills, weight loss/gain, headaches, insomnia,  night sweats or change in appetite. Does c/o fatigue. Eyes: Denies redness, blurred vision, diplopia, discharge, itchy or watery eyes.  ENT: Denies discharge, congestion, post nasal drip, epistaxis, sore throat, earache, hearing loss, dental pain, Tinnitus, Vertigo, Sinus pain or snoring.  Cardio: Denies chest pain, palpitations, irregular heartbeat, syncope, dyspnea, diaphoresis, orthopnea, PND, claudication or edema Respiratory: denies cough, dyspnea, DOE, pleurisy, hoarseness, laryngitis or wheezing.  Gastrointestinal: Denies dysphagia, heartburn, reflux, water brash, pain, cramps, nausea, vomiting, bloating, diarrhea, constipation, hematemesis, melena, hematochezia, jaundice or hemorrhoids Genitourinary: Denies dysuria, frequency, urgency, nocturia, hesitancy, discharge, hematuria or flank pain Musculoskeletal: Denies arthralgia, myalgia, stiffness, Jt. Swelling, pain, limp or strain/sprain. Denies Falls. Skin: Denies puritis, rash, hives, warts, acne, eczema or change in skin lesion Neuro: No weakness, tremor, incoordination, spasms, paresthesia or pain Psychiatric: Denies confusion, memory loss or sensory loss. Denies Depression. Endocrine: Denies change in weight, skin, hair change, nocturia, and paresthesia, diabetic polys, visual blurring or hyper / hypo glycemic episodes.  Heme/Lymph: No excessive bleeding, bruising or enlarged lymph nodes.   Physical Exam  BP 130/78   Pulse 97   Temp (!) 97.1 F (36.2 C)   Resp 17   Ht 5' 10.5" (1.791 m)   Wt 215 lb 12.8 oz (97.9 kg)   SpO2 96%   BMI 30.53 kg/m   General Appearance: Well nourished and well groomed and in no apparent distress.  Eyes: PERRLA,  EOMs, conjunctiva no swelling or erythema, normal fundi and vessels. Sinuses: No frontal/maxillary tenderness ENT/Mouth: EACs patent / TMs  nl. Nares clear without erythema, swelling, mucoid exudates. Oral hygiene is good. No erythema, swelling, or exudate. Tongue normal, non-obstructing. Tonsils not swollen or erythematous. Hearing normal.  Neck: Supple, thyroid not palpable. No bruits, nodes or JVD. Respiratory: Respiratory effort normal.  BS equal and clear bilateral without rales, rhonci, wheezing or stridor. Cardio: Heart sounds are normal with regular rate and rhythm and no murmurs, rubs or gallops. Peripheral pulses are normal and equal bilaterally without edema. No aortic or femoral bruits. Chest: symmetric with normal excursions and  percussion.  Abdomen: Soft, with Nl bowel sounds. Nontender, no guarding, rebound, hernias, masses, or organomegaly.  Lymphatics: Non tender without lymphadenopathy.  Musculoskeletal: Full ROM all peripheral extremities, joint stability, 5/5 strength, and normal gait. Skin: Warm and dry without rashes, lesions, cyanosis, clubbing or  ecchymosis.  Neuro: Cranial nerves intact, reflexes equal bilaterally. Normal muscle tone, no cerebellar symptoms. Sensation intact.  Pysch: Alert and oriented X 3 with normal affect, insight and judgment appropriate.   Assessment and Plan  1. Annual Preventative/Screening Exam    2. Labile hypertension  - EKG 12-Lead - [place], RETROPERITNL ABD,  LTD - Urinalysis, Routine w reflex microscopic - Microalbumin / creatinine urine ratio - CBC with Differential/Platelet - COMPLETE METABOLIC PANEL WITH GFR - Magnesium - TSH  3. Hyperlipidemia, mixed  - EKG 12-Lead - [place], RETROPERITNL ABD,  LTD - Lipid panel - TSH  4. Abnormal glucose  - EKG 12-Lead - [place], RETROPERITNL ABD,  LTD - Hemoglobin A1c - Insulin, random  5. Vitamin D deficiency  - VITAMIN D 25 Hydroxy   6. Testosterone deficiency  - Testosterone  7.  Screening examination for pulmonary tuberculosis  - TB Skin Test  8. Screening for colorectal cancer  - POC Hemoccult Bld/Stl   9. Prostate cancer screening  - PSA  10. Screening for ischemic heart disease  - EKG 12-Lead  11. FHx: heart disease  - EKG 12-Lead - [place], RETROPERITNL ABD,  LTD  12. Former smoker  - EKG 12-Lead - [place], RETROPERITNL ABD,  LTD  13. Fatigue, unspecified type  - [place], RETROPERITNL ABD,  LTD - Iron, Total/Total Iron Binding Cap - Vitamin B12 - Testosterone - CBC with Differential/Platelet - TSH  14. Medication management  - Urinalysis, Routine w reflex microscopic - Microalbumin / creatinine urine ratio - CBC with Differential/Platelet - COMPLETE METABOLIC PANEL WITH GFR - Magnesium - Lipid panel - TSH - Hemoglobin A1c - Insulin, random - VITAMIN D 25 Hydroxy          Patient was counseled in prudent diet, weight control to achieve/maintain BMI less than 25, BP monitoring, regular exercise and medications as discussed.  Discussed med effects and SE's. Routine screening labs and tests as requested with regular follow-up as recommended. Over 40 minutes of exam, counseling, chart review and high complex critical decision making was performed   Marinus Maw, MD

## 2020-12-15 ENCOUNTER — Encounter: Payer: Self-pay | Admitting: Internal Medicine

## 2020-12-15 ENCOUNTER — Other Ambulatory Visit: Payer: Self-pay

## 2020-12-15 ENCOUNTER — Ambulatory Visit (INDEPENDENT_AMBULATORY_CARE_PROVIDER_SITE_OTHER): Payer: No Typology Code available for payment source | Admitting: Internal Medicine

## 2020-12-15 VITALS — BP 130/78 | HR 97 | Temp 97.1°F | Resp 17 | Ht 70.5 in | Wt 215.8 lb

## 2020-12-15 DIAGNOSIS — Z13220 Encounter for screening for lipoid disorders: Secondary | ICD-10-CM

## 2020-12-15 DIAGNOSIS — Z8249 Family history of ischemic heart disease and other diseases of the circulatory system: Secondary | ICD-10-CM | POA: Diagnosis not present

## 2020-12-15 DIAGNOSIS — Z87891 Personal history of nicotine dependence: Secondary | ICD-10-CM

## 2020-12-15 DIAGNOSIS — Z79899 Other long term (current) drug therapy: Secondary | ICD-10-CM | POA: Diagnosis not present

## 2020-12-15 DIAGNOSIS — Z111 Encounter for screening for respiratory tuberculosis: Secondary | ICD-10-CM

## 2020-12-15 DIAGNOSIS — Z1329 Encounter for screening for other suspected endocrine disorder: Secondary | ICD-10-CM | POA: Diagnosis not present

## 2020-12-15 DIAGNOSIS — Z0001 Encounter for general adult medical examination with abnormal findings: Secondary | ICD-10-CM

## 2020-12-15 DIAGNOSIS — Z23 Encounter for immunization: Secondary | ICD-10-CM | POA: Diagnosis not present

## 2020-12-15 DIAGNOSIS — R350 Frequency of micturition: Secondary | ICD-10-CM

## 2020-12-15 DIAGNOSIS — R0989 Other specified symptoms and signs involving the circulatory and respiratory systems: Secondary | ICD-10-CM | POA: Diagnosis not present

## 2020-12-15 DIAGNOSIS — Z130 Encounter for screening for diseases of the blood and blood-forming organs and certain disorders involving the immune mechanism: Secondary | ICD-10-CM

## 2020-12-15 DIAGNOSIS — R5383 Other fatigue: Secondary | ICD-10-CM

## 2020-12-15 DIAGNOSIS — N401 Enlarged prostate with lower urinary tract symptoms: Secondary | ICD-10-CM

## 2020-12-15 DIAGNOSIS — Z1389 Encounter for screening for other disorder: Secondary | ICD-10-CM

## 2020-12-15 DIAGNOSIS — Z136 Encounter for screening for cardiovascular disorders: Secondary | ICD-10-CM

## 2020-12-15 DIAGNOSIS — E559 Vitamin D deficiency, unspecified: Secondary | ICD-10-CM

## 2020-12-15 DIAGNOSIS — Z131 Encounter for screening for diabetes mellitus: Secondary | ICD-10-CM | POA: Diagnosis not present

## 2020-12-15 DIAGNOSIS — Z1212 Encounter for screening for malignant neoplasm of rectum: Secondary | ICD-10-CM

## 2020-12-15 DIAGNOSIS — Z125 Encounter for screening for malignant neoplasm of prostate: Secondary | ICD-10-CM

## 2020-12-15 DIAGNOSIS — Z1211 Encounter for screening for malignant neoplasm of colon: Secondary | ICD-10-CM

## 2020-12-15 DIAGNOSIS — E349 Endocrine disorder, unspecified: Secondary | ICD-10-CM

## 2020-12-15 DIAGNOSIS — I10 Essential (primary) hypertension: Secondary | ICD-10-CM

## 2020-12-15 DIAGNOSIS — Z0000 Encounter for general adult medical examination without abnormal findings: Secondary | ICD-10-CM | POA: Diagnosis not present

## 2020-12-15 DIAGNOSIS — R7309 Other abnormal glucose: Secondary | ICD-10-CM

## 2020-12-15 DIAGNOSIS — E782 Mixed hyperlipidemia: Secondary | ICD-10-CM

## 2020-12-16 LAB — CBC WITH DIFFERENTIAL/PLATELET
Absolute Monocytes: 561 cells/uL (ref 200–950)
Basophils Absolute: 71 cells/uL (ref 0–200)
Basophils Relative: 1.2 %
Eosinophils Absolute: 189 cells/uL (ref 15–500)
Eosinophils Relative: 3.2 %
HCT: 47.9 % (ref 38.5–50.0)
Hemoglobin: 15.9 g/dL (ref 13.2–17.1)
Lymphs Abs: 2177 cells/uL (ref 850–3900)
MCH: 30.6 pg (ref 27.0–33.0)
MCHC: 33.2 g/dL (ref 32.0–36.0)
MCV: 92.3 fL (ref 80.0–100.0)
MPV: 12.2 fL (ref 7.5–12.5)
Monocytes Relative: 9.5 %
Neutro Abs: 2903 cells/uL (ref 1500–7800)
Neutrophils Relative %: 49.2 %
Platelets: 165 10*3/uL (ref 140–400)
RBC: 5.19 10*6/uL (ref 4.20–5.80)
RDW: 13 % (ref 11.0–15.0)
Total Lymphocyte: 36.9 %
WBC: 5.9 10*3/uL (ref 3.8–10.8)

## 2020-12-16 LAB — MAGNESIUM: Magnesium: 2.3 mg/dL (ref 1.5–2.5)

## 2020-12-16 LAB — URINALYSIS, ROUTINE W REFLEX MICROSCOPIC
Bilirubin Urine: NEGATIVE
Glucose, UA: NEGATIVE
Hgb urine dipstick: NEGATIVE
Ketones, ur: NEGATIVE
Leukocytes,Ua: NEGATIVE
Nitrite: NEGATIVE
Protein, ur: NEGATIVE
Specific Gravity, Urine: 1.017 (ref 1.001–1.035)
pH: 5.5 (ref 5.0–8.0)

## 2020-12-16 LAB — COMPLETE METABOLIC PANEL WITH GFR
AG Ratio: 1.6 (calc) (ref 1.0–2.5)
ALT: 17 U/L (ref 9–46)
AST: 18 U/L (ref 10–35)
Albumin: 4.5 g/dL (ref 3.6–5.1)
Alkaline phosphatase (APISO): 138 U/L (ref 35–144)
BUN: 15 mg/dL (ref 7–25)
CO2: 27 mmol/L (ref 20–32)
Calcium: 9.6 mg/dL (ref 8.6–10.3)
Chloride: 106 mmol/L (ref 98–110)
Creat: 0.94 mg/dL (ref 0.70–1.35)
Globulin: 2.8 g/dL (calc) (ref 1.9–3.7)
Glucose, Bld: 101 mg/dL — ABNORMAL HIGH (ref 65–99)
Potassium: 4.3 mmol/L (ref 3.5–5.3)
Sodium: 142 mmol/L (ref 135–146)
Total Bilirubin: 0.9 mg/dL (ref 0.2–1.2)
Total Protein: 7.3 g/dL (ref 6.1–8.1)
eGFR: 92 mL/min/{1.73_m2} (ref >=60)

## 2020-12-16 LAB — HEMOGLOBIN A1C
Hgb A1c MFr Bld: 5.3 % of total Hgb (ref <5.7)
Mean Plasma Glucose: 105 mg/dL
eAG (mmol/L): 5.8 mmol/L

## 2020-12-16 LAB — LIPID PANEL
Cholesterol: 151 mg/dL (ref <200)
HDL: 49 mg/dL (ref >=40)
LDL Cholesterol (Calc): 81 mg/dL (calc)
Non-HDL Cholesterol (Calc): 102 mg/dL (calc) (ref <130)
Total CHOL/HDL Ratio: 3.1 (calc) (ref <5.0)
Triglycerides: 114 mg/dL (ref <150)

## 2020-12-16 LAB — VITAMIN B12: Vitamin B-12: 443 pg/mL (ref 200–1100)

## 2020-12-16 LAB — IRON, TOTAL/TOTAL IRON BINDING CAP
%SAT: 22 % (calc) (ref 20–48)
Iron: 69 ug/dL (ref 50–180)
TIBC: 311 mcg/dL (calc) (ref 250–425)

## 2020-12-16 LAB — TSH: TSH: 0.99 mIU/L (ref 0.40–4.50)

## 2020-12-16 LAB — PSA: PSA: 0.77 ng/mL (ref <=4.00)

## 2020-12-16 LAB — MICROALBUMIN / CREATININE URINE RATIO
Creatinine, Urine: 141 mg/dL (ref 20–320)
Microalb Creat Ratio: 2 mcg/mg creat (ref <30)
Microalb, Ur: 0.3 mg/dL

## 2020-12-16 LAB — INSULIN, RANDOM: Insulin: 16.9 u[IU]/mL

## 2020-12-16 LAB — TESTOSTERONE: Testosterone: 284 ng/dL (ref 250–827)

## 2020-12-16 LAB — VITAMIN D 25 HYDROXY (VIT D DEFICIENCY, FRACTURES): Vit D, 25-Hydroxy: 61 ng/mL (ref 30–100)

## 2020-12-16 NOTE — Progress Notes (Signed)
============================================================ -   Test results slightly outside the reference range are not unusual. If there is anything important, I will review this with you,  otherwise it is considered normal test values.  If you have further questions,  please do not hesitate to contact me at the office or via My Chart.  ============================================================ ============================================================  -  Iron & Vitamin B12 levels  - Both Normal & OK  ============================================================ ============================================================  -  PSA - Very Low - Great ! ============================================================ ============================================================  -  Testosterone Normal range   ( but is low normal range - Taking Zinc 50 mg tab daily                                                           frequently helps raise Testerone levels)  ============================================================ ============================================================  -  Total Chol = 151   &  LDL  Chol = 81 - Both  Excellent   - Very low risk for Heart Attack  / Stroke ============================================================ ============================================================  -  A1c - Normal - No Diabetes  - Great ! ============================================================ ============================================================  -  Vitamin D = 61 - Excellent    ! ============================================================ ============================================================  -  All Else - CBC - Kidneys - Electrolytes - Liver - Magnesium & Thyroid    - all  Normal /  OK ============================================================ ============================================================

## 2020-12-18 ENCOUNTER — Encounter: Payer: Self-pay | Admitting: Internal Medicine

## 2021-03-09 ENCOUNTER — Other Ambulatory Visit: Payer: Self-pay | Admitting: Adult Health

## 2021-03-09 DIAGNOSIS — E782 Mixed hyperlipidemia: Secondary | ICD-10-CM

## 2021-03-12 ENCOUNTER — Other Ambulatory Visit: Payer: Self-pay | Admitting: Internal Medicine

## 2021-03-12 DIAGNOSIS — E782 Mixed hyperlipidemia: Secondary | ICD-10-CM

## 2021-03-12 MED ORDER — ROSUVASTATIN CALCIUM 40 MG PO TABS: ORAL_TABLET | ORAL | 3 refills | Status: DC

## 2021-05-20 ENCOUNTER — Encounter: Payer: Self-pay | Admitting: Internal Medicine

## 2021-05-20 ENCOUNTER — Other Ambulatory Visit: Payer: Self-pay | Admitting: Internal Medicine

## 2021-05-20 DIAGNOSIS — Z1211 Encounter for screening for malignant neoplasm of colon: Secondary | ICD-10-CM

## 2021-07-18 IMAGING — US US ABDOMEN LIMITED
1 series · 14 of 25 positions shown · non-contrast
Comparison: None.

CLINICAL DATA: Right upper quadrant pain

EXAM:
ULTRASOUND ABDOMEN LIMITED RIGHT UPPER QUADRANT

[Series 1: us abdomen limited ruq (liver/gb) · 56 acquisitions, 14 frames shown]
[im 1/56]
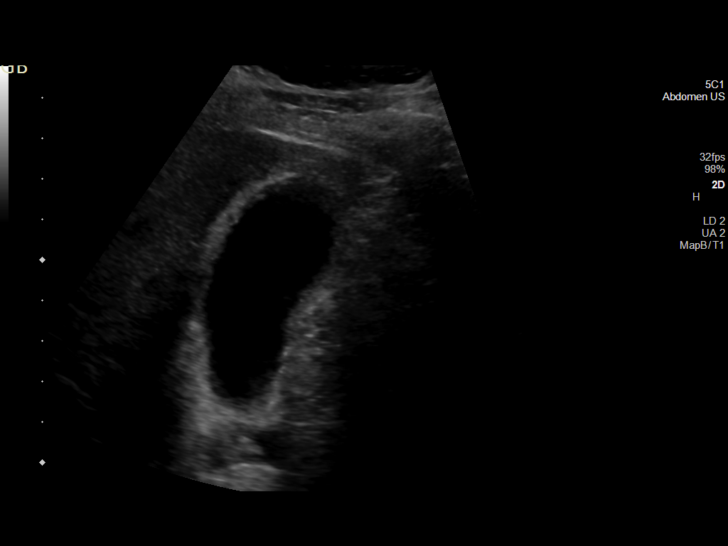
[im 5/56]
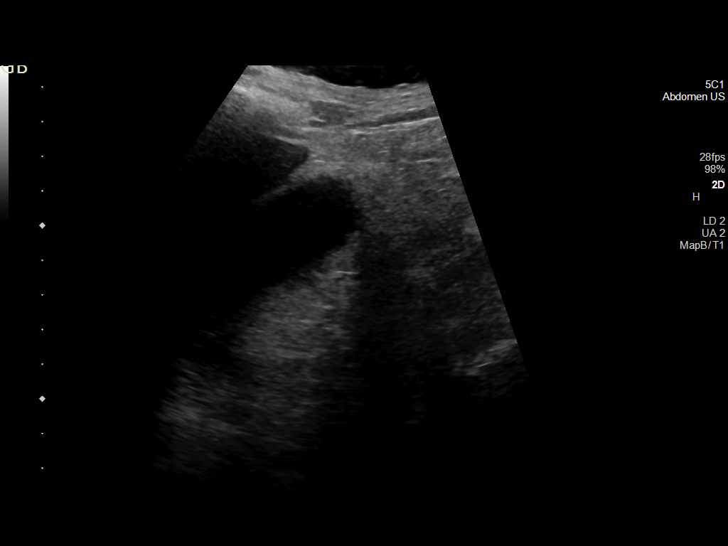
[im 10/56]
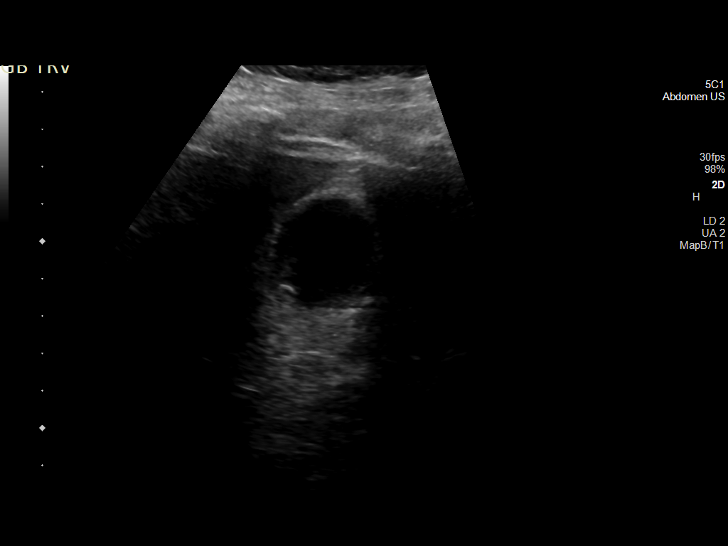
[im 14/56]
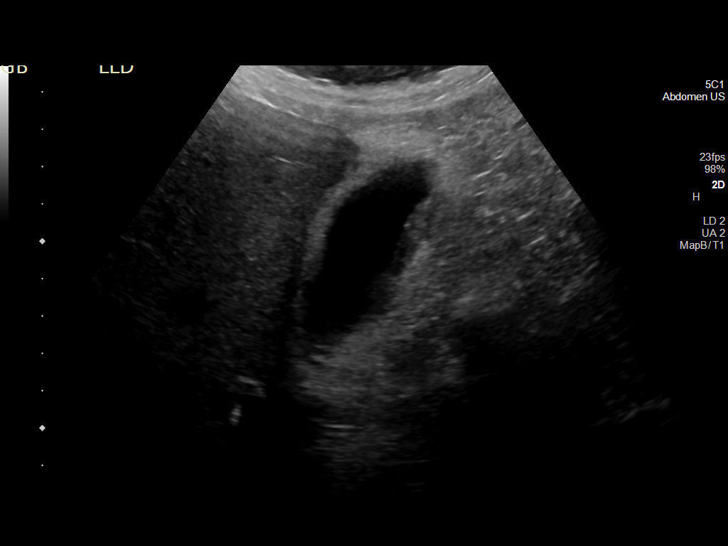
[im 19/56]
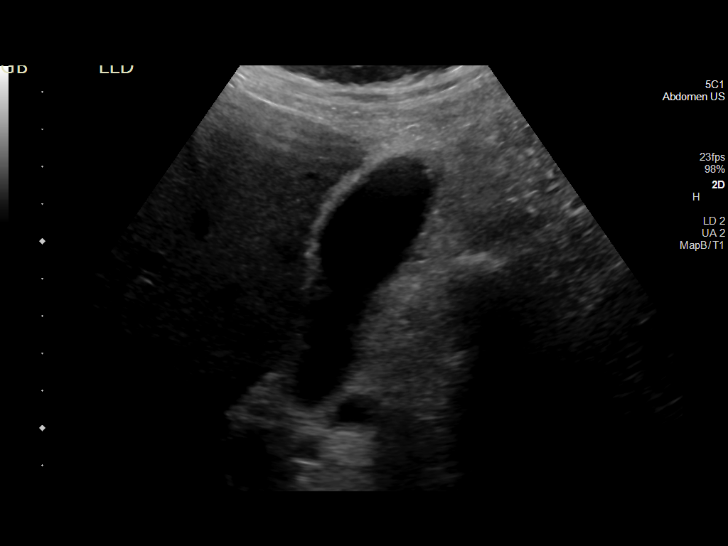
[im 21/56]
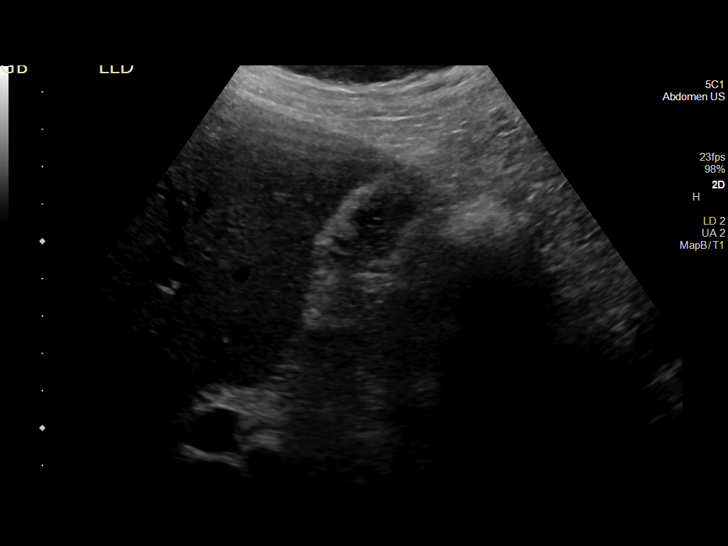
[im 26/56]
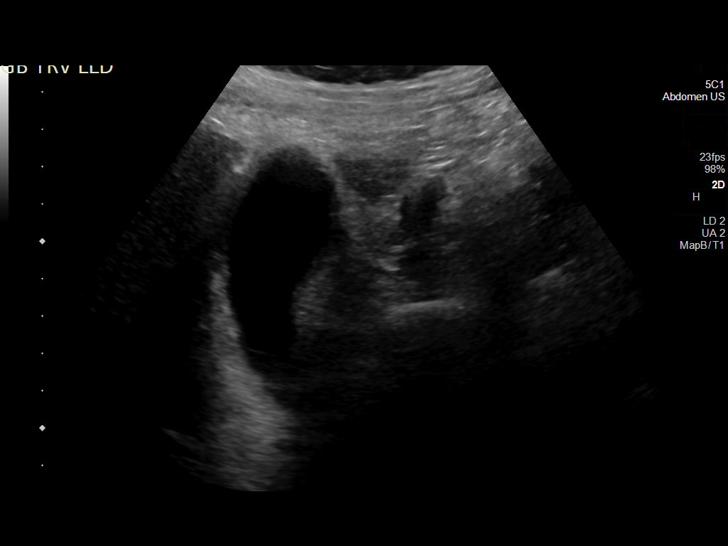
[im 30/56]
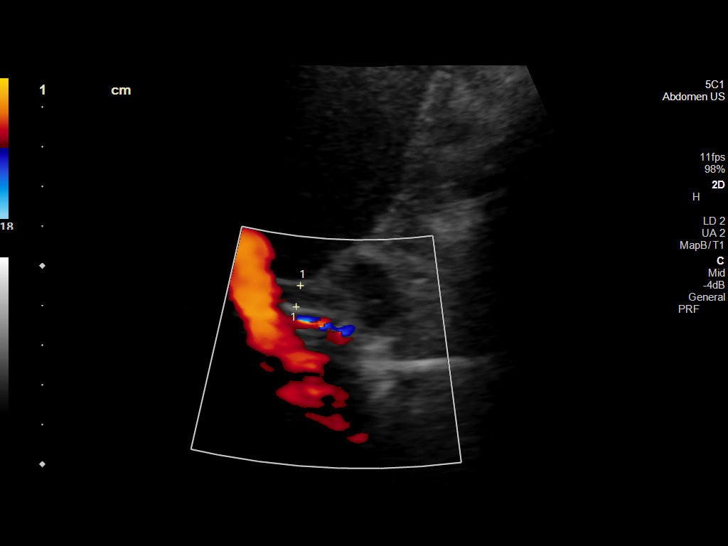
[im 35/56]
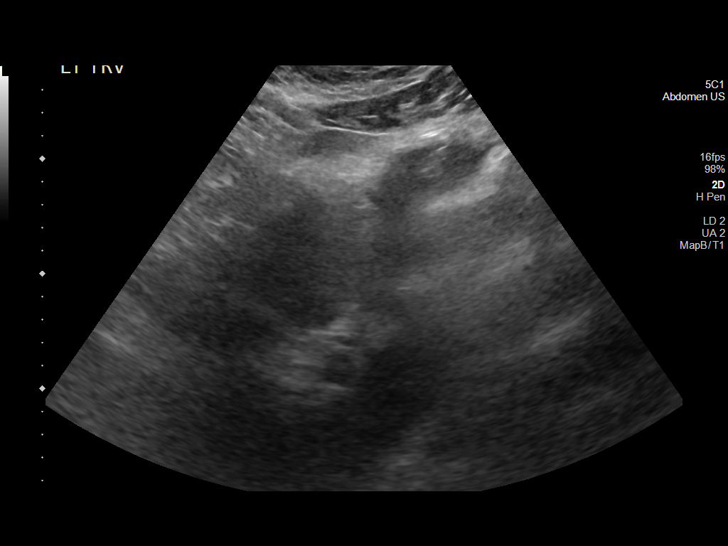
[im 37/56]
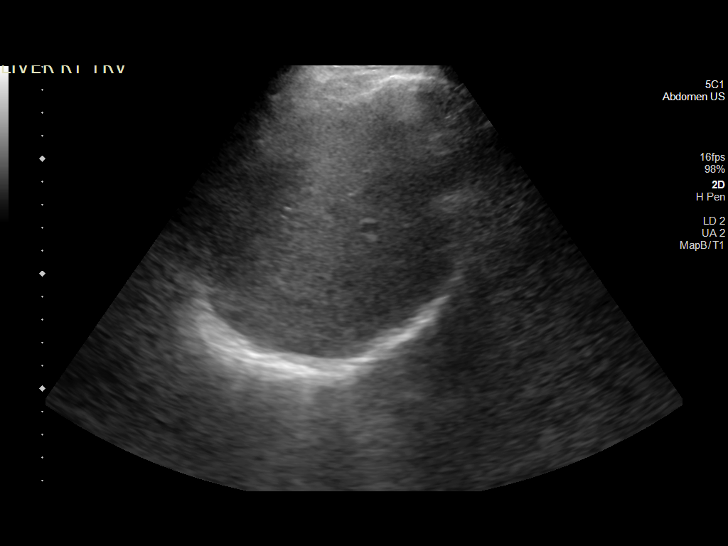
[im 42/56]
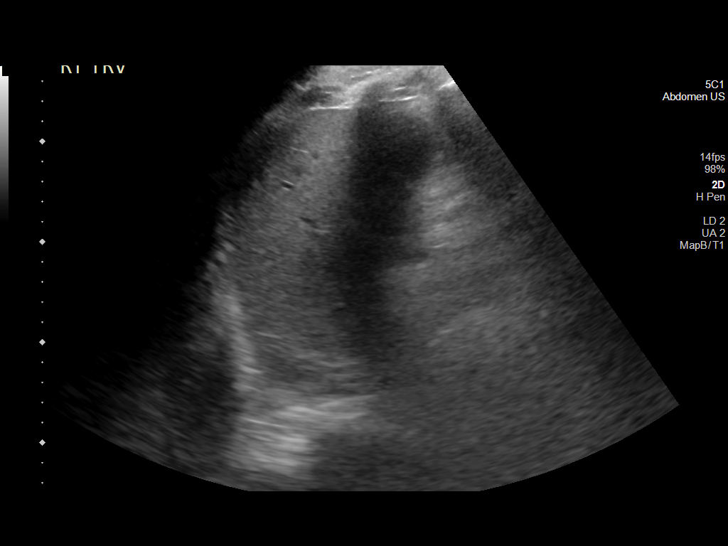
[im 46/56]
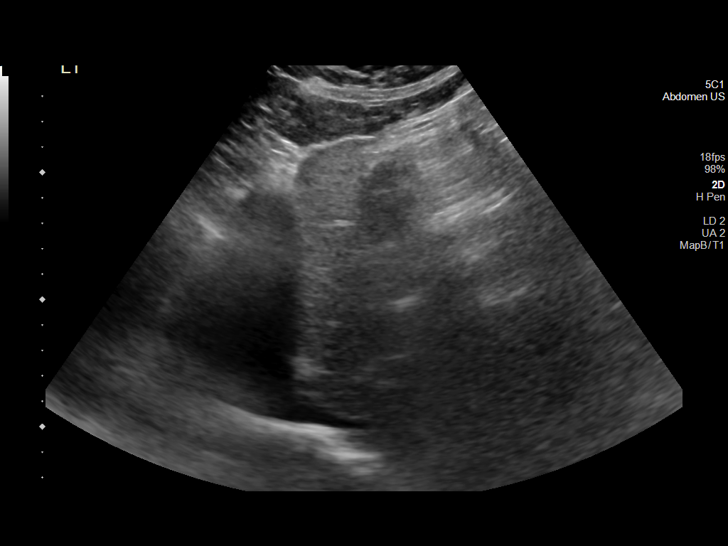
[im 51/56]
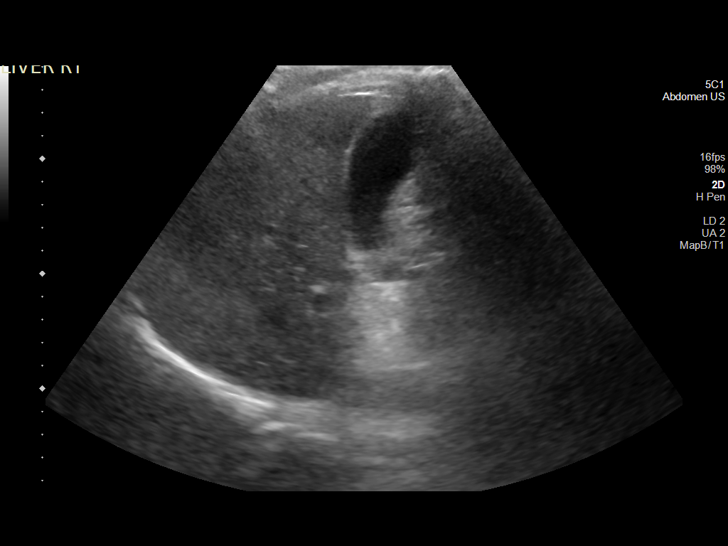
[im 56/56]
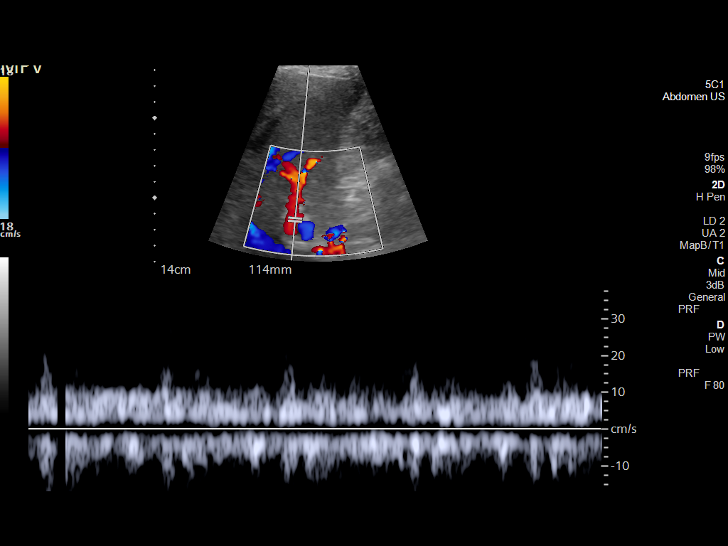

[14 of 25 positions shown; findings below may reference images not displayed]

FINDINGS: Gallbladder:

Sludge and small stones within the gallbladder. Slight increased
wall thickness at 5 mm. Positive sonographic Murphy.

Common bile duct:

Diameter: 6 mm

Liver:

No focal lesion identified. Within normal limits in parenchymal
echogenicity. Portal vein is patent on color Doppler imaging with
normal direction of blood flow towards the liver.

Other: None.
IMPRESSION: 1. Sludge and small stones in the gallbladder with increased wall
thickness and positive sonographic Murphy, collective findings are
suspicious for an acute cholecystitis.
2. Common duct diameter upper limits of normal

## 2022-01-03 ENCOUNTER — Ambulatory Visit (INDEPENDENT_AMBULATORY_CARE_PROVIDER_SITE_OTHER): Payer: Managed Care, Other (non HMO)

## 2022-01-03 VITALS — Temp 98.0°F

## 2022-01-03 DIAGNOSIS — Z23 Encounter for immunization: Secondary | ICD-10-CM

## 2022-02-19 ENCOUNTER — Ambulatory Visit (INDEPENDENT_AMBULATORY_CARE_PROVIDER_SITE_OTHER): Payer: No Typology Code available for payment source | Admitting: Internal Medicine

## 2022-02-19 ENCOUNTER — Encounter: Payer: Self-pay | Admitting: Internal Medicine

## 2022-02-19 VITALS — BP 118/74 | HR 80 | Temp 97.9°F | Resp 16 | Ht 70.5 in | Wt 234.4 lb

## 2022-02-19 DIAGNOSIS — R350 Frequency of micturition: Secondary | ICD-10-CM | POA: Diagnosis not present

## 2022-02-19 DIAGNOSIS — E559 Vitamin D deficiency, unspecified: Secondary | ICD-10-CM | POA: Diagnosis not present

## 2022-02-19 DIAGNOSIS — Z125 Encounter for screening for malignant neoplasm of prostate: Secondary | ICD-10-CM | POA: Diagnosis not present

## 2022-02-19 DIAGNOSIS — Z87891 Personal history of nicotine dependence: Secondary | ICD-10-CM

## 2022-02-19 DIAGNOSIS — Z13220 Encounter for screening for lipoid disorders: Secondary | ICD-10-CM

## 2022-02-19 DIAGNOSIS — Z111 Encounter for screening for respiratory tuberculosis: Secondary | ICD-10-CM | POA: Diagnosis not present

## 2022-02-19 DIAGNOSIS — Z1329 Encounter for screening for other suspected endocrine disorder: Secondary | ICD-10-CM | POA: Diagnosis not present

## 2022-02-19 DIAGNOSIS — G4733 Obstructive sleep apnea (adult) (pediatric): Secondary | ICD-10-CM

## 2022-02-19 DIAGNOSIS — I700 Atherosclerosis of aorta: Secondary | ICD-10-CM

## 2022-02-19 DIAGNOSIS — Z131 Encounter for screening for diabetes mellitus: Secondary | ICD-10-CM | POA: Diagnosis not present

## 2022-02-19 DIAGNOSIS — Z79899 Other long term (current) drug therapy: Secondary | ICD-10-CM

## 2022-02-19 DIAGNOSIS — Z136 Encounter for screening for cardiovascular disorders: Secondary | ICD-10-CM | POA: Diagnosis not present

## 2022-02-19 DIAGNOSIS — Z0000 Encounter for general adult medical examination without abnormal findings: Secondary | ICD-10-CM

## 2022-02-19 DIAGNOSIS — R0989 Other specified symptoms and signs involving the circulatory and respiratory systems: Secondary | ICD-10-CM

## 2022-02-19 DIAGNOSIS — N401 Enlarged prostate with lower urinary tract symptoms: Secondary | ICD-10-CM | POA: Diagnosis not present

## 2022-02-19 DIAGNOSIS — Z1389 Encounter for screening for other disorder: Secondary | ICD-10-CM

## 2022-02-19 DIAGNOSIS — Z1211 Encounter for screening for malignant neoplasm of colon: Secondary | ICD-10-CM

## 2022-02-19 DIAGNOSIS — Z8249 Family history of ischemic heart disease and other diseases of the circulatory system: Secondary | ICD-10-CM

## 2022-02-19 DIAGNOSIS — E349 Endocrine disorder, unspecified: Secondary | ICD-10-CM

## 2022-02-19 DIAGNOSIS — Z130 Encounter for screening for diseases of the blood and blood-forming organs and certain disorders involving the immune mechanism: Secondary | ICD-10-CM | POA: Diagnosis not present

## 2022-02-19 DIAGNOSIS — E782 Mixed hyperlipidemia: Secondary | ICD-10-CM

## 2022-02-19 DIAGNOSIS — Z0001 Encounter for general adult medical examination with abnormal findings: Secondary | ICD-10-CM

## 2022-02-19 DIAGNOSIS — R7309 Other abnormal glucose: Secondary | ICD-10-CM

## 2022-02-19 DIAGNOSIS — R5383 Other fatigue: Secondary | ICD-10-CM

## 2022-02-19 NOTE — Progress Notes (Signed)
Annual  Screening/Preventative Visit  & Comprehensive Evaluation & Examination   Future Appointments  Date Time Provider Department  02/19/2022  3:00 PM Lucky Cowboy, MD [facility]  02/26/2023  3:00 PM Lucky Cowboy, MD [facility]              This very nice 61 y.o. MWM with  labile  HTN, HLD, Prediabetes and Vitamin D Deficiency presents for a Screening /Preventative Visit & comprehensive evaluation and management of multiple medical co-morbidities.   Patient had hx/o OSA cured after a 35 # weight loss. Patient has hx/o GERD controlled on diet .        Labile HTN followed expectantly predates since 2013. Patient's BP has been controlled at home.  Today's BP is at goal - 118/74. Patient denies any cardiac symptoms as chest pain, palpitations, shortness of breath, dizziness or ankle swelling.        Patient's hyperlipidemia is controlled with diet and Rosuvastatin. Patient denies myalgias or other medication SE's. Last lipids were at goal:  Lab Results  Component Value Date   CHOL 151 12/15/2020   HDL 49 12/15/2020   LDLCALC 81 12/15/2020   TRIG 114 12/15/2020   CHOLHDL 3.1 12/15/2020                                                      Patient has hx/o Testosterone Deficiency ("220" /2012) and declined treatment.         Patient has hx/o prediabetes (A1c 5.7% /2009) and patient denies reactive hypoglycemic symptoms, visual blurring, diabetic polys or paresthesias. Last A1c was normal  & at goal:  Lab Results  Component Value Date   HGBA1C 5.3 12/15/2020         Finally, patient has history of Vitamin D Deficiency ("39" /2016) and last vitamin D was at goal :   Lab Results  Component Value Date   VD25OH 61 12/15/2020       Current Outpatient Medications:     aspirin 81 MG EC tablet, Take at bedtime., Disp: , Rfl:    B Complex Vitamins, Take by mouth daily., Disp: , Rfl:    Calcium Carbonate-Vit D-Min (CALCIUM 1200 PO), Take  daily., Disp: , Rfl:     VITAMIN D  10,000 Units  , Take  daily., Disp: , Rfl:    Coenzyme Q10 , Take 1 capsule  daily., Disp: , Rfl:    ferrous fumarate (HEMOCYTE - 106 MG FE) 325 (106 FE) MG TABS tablet, Take 1 tablet once a week., Disp: , Rfl:    Multiple Vitamin p, Take 1 tablet daily., Disp: , Rfl:    [facility]   rosuvastatin  40 MG tablet, Take  1 tablet  Daily  for Cholesterol, Disp: 90 tablet, Rfl: 3   sildenafil 100 MG tablet, Take 1/2 to 1 tablet  Daily as needed  Disp: 30 tablet, Rfl: 2    No Known Allergies    Past Medical History:  Diagnosis Date   GERD (gastroesophageal reflux disease)    History of ETT    cardiolite > 10 years ago was normal per patient's report   Hyperlipidemia    Low testosterone    OSA (obstructive sleep apnea)    DOES NOT USE CPAP   Sciatica    Health Maintenance  Topic Date Due   Zoster Vaccines- Shingrix (1 of 2) Never done   FOOT EXAM  10/28/2014   HEMOGLOBIN A1C  06/13/2020   OPHTHALMOLOGY EXAM  07/29/2020   INFLUENZA VACCINE  10/10/2020   URINE MICROALBUMIN  12/13/2020   TETANUS/TDAP  11/11/2028   Hepatitis C Screening  Completed   HIV Screening  Completed   HPV VACCINES  Aged Out    Immunization History  Administered Date(s) Administered   Hepatitis B 09/02/2013, 10/01/2013, 01/20/2014   Influenza 01/03/2018   Moderna Sars-Covid-2 Vacc 06/22/2019, 07/13/2019   PPD Test 06/17/2017, 11/12/2018, 12/14/2019   Pneumococcal -23 01/12/2008, 06/17/2017   Tdap 02/10/2008, 11/12/2018     Last Colon - 06/2009 Dr Kinnie Scales    Past Surgical History:  Procedure Laterality Date   APPENDECTOMY     CHOLECYSTECTOMY [name] 09/06/2020   Procedure: LAPAROSCOPIC CHOLECYSTECTOMY WITH INTRAOPERATIVE CHOLANGIOGRAM;  Surgeon: Manus Rudd, MD;  Location: [facility] OR;  Service: General;  Laterality: [name];   LEG SURGERY Left    LOWER LEFT     Family History  Problem Relation Age of Onset   Heart attack Father 45   Heart attack Brother 34   Heart  attack Sister 9     Social History   Occupation: Research officer, trade union for [employer]   Tobacco Use   Smoking status: Former    Types: Cigarettes    Quit date: 03/12/1986    Years since quitting: 34.7   Smokeless tobacco: Never  Substance Use Topics   Alcohol use: Yes    Alcohol/week: 0.0 standard drinks    Comment: very rarely   Drug use: Never      ROS Constitutional: Denies fever, chills, weight loss/gain, headaches, insomnia,  night sweats or change in appetite. Does c/o fatigue. Eyes: Denies redness, blurred vision, diplopia, discharge, itchy or watery eyes.  ENT: Denies discharge, congestion, post nasal drip, epistaxis, sore throat, earache, hearing loss, dental pain, Tinnitus, Vertigo, Sinus pain or snoring.  Cardio: Denies chest pain, palpitations, irregular heartbeat, syncope, dyspnea, diaphoresis, orthopnea, PND, claudication or edema Respiratory: denies cough, dyspnea, DOE, pleurisy, hoarseness, laryngitis or wheezing.  Gastrointestinal: Denies dysphagia, heartburn, reflux, water brash, pain, cramps, nausea, vomiting, bloating, diarrhea, constipation, hematemesis, melena, hematochezia, jaundice or hemorrhoids Genitourinary: Denies dysuria, frequency, urgency, nocturia, hesitancy, discharge, hematuria or flank pain Musculoskeletal: Denies arthralgia, myalgia, stiffness, Jt. Swelling, pain, limp or strain/sprain. Denies Falls. Skin: Denies puritis, rash, hives, warts, acne, eczema or change in skin lesion Neuro: No weakness, tremor, incoordination, spasms, paresthesia or pain Psychiatric: Denies confusion, memory loss or sensory loss. Denies Depression. Endocrine: Denies change in weight, skin, hair change, nocturia, and paresthesia, diabetic polys, visual blurring or hyper / hypo glycemic episodes.  Heme/Lymph: No excessive bleeding, bruising or enlarged lymph nodes.   Physical Exam  BP 118/74   Pulse 80   Temp 97.9 F (36.6 C)   Resp 16   Ht 5' 10.5" (1.791 m)    Wt 234 lb 6.4 oz (106.3 kg)   SpO2 95%   BMI 33.16 kg/m   General Appearance: Well nourished and well groomed and in no apparent distress.  Eyes: PERRLA, EOMs, conjunctiva no swelling or erythema, normal fundi and vessels. Sinuses: No frontal/maxillary tenderness ENT/Mouth: EACs patent / TMs  nl. Nares clear without erythema, swelling, mucoid exudates. Oral hygiene is good. No erythema, swelling, or exudate. Tongue normal, non-obstructing. Tonsils not swollen or erythematous. Hearing normal.  Neck: Supple, thyroid not palpable. No bruits, nodes or JVD. Respiratory: Respiratory effort normal.  BS equal and clear bilateral without rales, rhonci, wheezing or stridor. Cardio: Heart sounds are normal with regular rate and rhythm and no murmurs, rubs or gallops. Peripheral pulses are normal and equal bilaterally without edema. No aortic or femoral bruits. Chest: symmetric with normal excursions and percussion.  Abdomen: Soft, with Nl bowel sounds. Nontender, no guarding, rebound, hernias, masses, or organomegaly.  Lymphatics: Non tender without lymphadenopathy.  Musculoskeletal: Full ROM all peripheral extremities, joint stability, 5/5 strength, and normal gait. Skin: Warm and dry without rashes, lesions, cyanosis, clubbing or  ecchymosis.  Neuro: Cranial nerves intact, reflexes equal bilaterally. Normal muscle tone, no cerebellar symptoms. Sensation intact.  Pysch: Alert and oriented X 3 with normal affect, insight and judgment appropriate.   Assessment and Plan  1. Annual Preventative/Screening Exam    2. Labile hypertension  - EKG 12-Lead - [place], RETROPERITNL ABD,  LTD - Urinalysis, Routine w reflex microscopic - Microalbumin / creatinine urine ratio - CBC with Differential/Platelet - COMPLETE METABOLIC PANEL WITH GFR - Magnesium - TSH  3. Hyperlipidemia, mixed  - EKG 12-Lead - [place], RETROPERITNL ABD,  LTD - Lipid panel - TSH  4. Abnormal glucose  - EKG 12-Lead - [place],  RETROPERITNL ABD,  LTD - Hemoglobin A1c - Insulin, random  5. Vitamin D deficiency  - VITAMIN D 25 Hydroxy  6. Prostate cancer screening  - PSA  7. Screening for colorectal cancer  - Cologuard  8. OSA    9. Testosterone deficiency  - Testosterone  10. Screening-pulmonary TB  - TB Skin Test  11. Screening for heart disease  - EKG 12-Lead  12. FHx: heart disease  - EKG 12-Lead - [place], RETROPERITNL ABD,  LTD  13. Former smoker  - EKG 12-Lead - [place], RETROPERITNL ABD,  LTD  14. Screening for AAA (aortic abdominal aneurysm)  - [place], RETROPERITNL ABD,  LTD  15. Fatigue,  - Iron, Total/Total Iron Binding Cap - Vitamin B12 - Testosterone - CBC with Differential/Platelet - TSH  16. Medication management  - Urinalysis, Routine w reflex microscopic - Microalbumin / creatinine urine ratio - CBC with Differential/Platelet - COMPLETE METABOLIC PANEL WITH GFR - Magnesium - Lipid panel - TSH - Hemoglobin A1c - Insulin, random - VITAMIN D 25 Hydroxy          Patient was counseled in prudent diet, weight control to achieve/maintain BMI less than 25, BP monitoring, regular exercise and medications as discussed.  Discussed med effects and SE's. Routine screening labs and tests as requested with regular follow-up as recommended. Over 40 minutes of exam, counseling, chart review and high complex critical decision making was performed   Marinus Maw, MD

## 2022-02-19 NOTE — Patient Instructions (Signed)

## 2022-02-20 ENCOUNTER — Other Ambulatory Visit: Payer: Self-pay | Admitting: Internal Medicine

## 2022-02-20 LAB — COMPLETE METABOLIC PANEL WITH GFR
AG Ratio: 1.6 (calc) (ref 1.0–2.5)
ALT: 17 U/L (ref 9–46)
AST: 19 U/L (ref 10–35)
Albumin: 4.3 g/dL (ref 3.6–5.1)
Alkaline phosphatase (APISO): 123 U/L (ref 35–144)
BUN: 17 mg/dL (ref 7–25)
CO2: 27 mmol/L (ref 20–32)
Calcium: 9.2 mg/dL (ref 8.6–10.3)
Chloride: 105 mmol/L (ref 98–110)
Creat: 1.04 mg/dL (ref 0.70–1.35)
Globulin: 2.7 g/dL (calc) (ref 1.9–3.7)
Glucose, Bld: 110 mg/dL — ABNORMAL HIGH (ref 65–99)
Potassium: 4.2 mmol/L (ref 3.5–5.3)
Sodium: 142 mmol/L (ref 135–146)
Total Bilirubin: 0.6 mg/dL (ref 0.2–1.2)
Total Protein: 7 g/dL (ref 6.1–8.1)
eGFR: 81 mL/min/{1.73_m2} (ref >=60)

## 2022-02-20 LAB — MICROALBUMIN / CREATININE URINE RATIO
Creatinine, Urine: 139 mg/dL (ref 20–320)
Microalb, Ur: <0.2 mg/dL

## 2022-02-20 LAB — TSH: TSH: 1.12 mIU/L (ref 0.40–4.50)

## 2022-02-20 LAB — PSA: PSA: 0.71 ng/mL (ref <=4.00)

## 2022-02-20 LAB — LIPID PANEL
Cholesterol: 147 mg/dL (ref <200)
HDL: 55 mg/dL (ref >=40)
LDL Cholesterol (Calc): 58 mg/dL (calc)
Non-HDL Cholesterol (Calc): 92 mg/dL (calc) (ref <130)
Total CHOL/HDL Ratio: 2.7 (calc) (ref <5.0)
Triglycerides: 289 mg/dL — ABNORMAL HIGH (ref <150)

## 2022-02-20 LAB — CBC WITH DIFFERENTIAL/PLATELET
Absolute Monocytes: 637 cells/uL (ref 200–950)
Basophils Absolute: 63 cells/uL (ref 0–200)
Basophils Relative: 0.9 %
Eosinophils Absolute: 210 cells/uL (ref 15–500)
Eosinophils Relative: 3 %
HCT: 44.4 % (ref 38.5–50.0)
Hemoglobin: 15 g/dL (ref 13.2–17.1)
Lymphs Abs: 2443 cells/uL (ref 850–3900)
MCH: 31 pg (ref 27.0–33.0)
MCHC: 33.8 g/dL (ref 32.0–36.0)
MCV: 91.7 fL (ref 80.0–100.0)
MPV: 12.9 fL — ABNORMAL HIGH (ref 7.5–12.5)
Monocytes Relative: 9.1 %
Neutro Abs: 3647 cells/uL (ref 1500–7800)
Neutrophils Relative %: 52.1 %
Platelets: 159 10*3/uL (ref 140–400)
RBC: 4.84 10*6/uL (ref 4.20–5.80)
RDW: 12.6 % (ref 11.0–15.0)
Total Lymphocyte: 34.9 %
WBC: 7 10*3/uL (ref 3.8–10.8)

## 2022-02-20 LAB — URINALYSIS, ROUTINE W REFLEX MICROSCOPIC
Bilirubin Urine: NEGATIVE
Glucose, UA: NEGATIVE
Hgb urine dipstick: NEGATIVE
Ketones, ur: NEGATIVE
Leukocytes,Ua: NEGATIVE
Nitrite: NEGATIVE
Protein, ur: NEGATIVE
Specific Gravity, Urine: 1.023 (ref 1.001–1.035)
pH: 5.5 (ref 5.0–8.0)

## 2022-02-20 LAB — IRON, TOTAL/TOTAL IRON BINDING CAP
%SAT: 22 % (calc) (ref 20–48)
Iron: 69 ug/dL (ref 50–180)
TIBC: 311 mcg/dL (calc) (ref 250–425)

## 2022-02-20 LAB — HEMOGLOBIN A1C
Hgb A1c MFr Bld: 5.7 % of total Hgb — ABNORMAL HIGH (ref <5.7)
Mean Plasma Glucose: 117 mg/dL
eAG (mmol/L): 6.5 mmol/L

## 2022-02-20 LAB — VITAMIN D 25 HYDROXY (VIT D DEFICIENCY, FRACTURES): Vit D, 25-Hydroxy: 62 ng/mL (ref 30–100)

## 2022-02-20 LAB — TESTOSTERONE: Testosterone: 240 ng/dL — ABNORMAL LOW (ref 250–827)

## 2022-02-20 LAB — MAGNESIUM: Magnesium: 2.2 mg/dL (ref 1.5–2.5)

## 2022-02-20 LAB — VITAMIN B12: Vitamin B-12: 777 pg/mL (ref 200–1100)

## 2022-02-20 LAB — INSULIN, RANDOM: Insulin: 127.7 u[IU]/mL — ABNORMAL HIGH

## 2022-02-20 NOTE — Progress Notes (Signed)
<><><><><><><><><><><><><><><><><><><><><><><><><><><><><><><><><> <><><><><><><><><><><><><><><><><><><><><><><><><><><><><><><><><> - Test results slightly outside the reference range are not unusual. If there is anything important, I will review this with you,  otherwise it is considered normal test values.  If you have further questions,  please do not hesitate to contact me at the office or via My Chart.  <><><><><><><><><><><><><><><><><><><><><><><><><><><><><><><><><> <><><><><><><><><><><><><><><><><><><><><><><><><><><><><><><><><>  -  Testosterone  = 240 - is Low  -  Recommend take Zinc 50 mg tab which may help raise                                                                       Testosterone levels naturally   -  Also exercise 20-30 minutes  2 x /day will help raise Testosterone levels  -  Losing weight helps raise Testosterone levels as                                     fat tissue metabolizes Testosterone into Estrogen !  <><><><><><><><><><><><><><><><><><><><><><><><><><><><><><><><><> <><><><><><><><><><><><><><><><><><><><><><><><><><><><><><><><><>  -  Total  Chol =    147    - Excellent             (  Ideal  or  Goal is less than 180  !  )  & -  Bad / Dangerous LDL  Chol = 58   - also Excellent             (  Ideal  or  Goal is less than 70  !  )   - Very low risk for Heart Attack  / Stroke <><><><><><><><><><><><><><><><><><><><><><><><><><><><><><><><><> <><><><><><><><><><><><><><><><><><><><><><><><><><><><><><><><><>  -  But Triglycerides (   289  ) or fats in blood are too high                 (   Ideal or  Goal is less than 150  !  )    - Recommend avoid fried & greasy foods,  sweets / candy,   - Avoid white rice  (brown or wild rice or Quinoa is OK),   - Avoid white potatoes  (sweet potatoes are OK)   - Avoid anything made from white flour  - bagels, doughnuts, rolls, buns, biscuits, white and   wheat breads, pizza crust and traditional   pasta made of white flour & egg white  - (vegetarian pasta or spinach or wheat pasta is OK).    - Multi-grain bread is OK - like multi-grain flat bread or  sandwich thins.   - Avoid alcohol in excess.   - Exercise is also important. <><><><><><><><><><><><><><><><><><><><><><><><><><><><><><><><><> <><><><><><><><><><><><><><><><><><><><><><><><><><><><><><><><><>  -  A1c = 5.7% is now B elevated in the borderline and                                                           early or pre-diabetes range which has the same   300% increased risk for heart attack, stroke, cancer and  alzheimer- type vascular dementia as full blown diabetes.   But the good news is that diet, exercise with weight loss can                                                                                cure the early diabetes at this point. <><><><><><><><><><><><><><><><><><><><><><><><><><><><><><><><><> <><><><><><><><><><><><><><><><><><><><><><><><><><><><><><><><><>  -   So   - Avoid Sweets, Candy & White Stuff   - White Rice, White Potatoes, White Flour  - Breads &  Pasta  - And lose weight ! <><><><><><><><><><><><><><><><><><><><><><><><><><><><><><><><><> <><><><><><><><><><><><><><><><><><><><><><><><><><><><><><><><><>  -  Insulin = 127.7   ( Normal is less than 20 ! )    and   shows insulin resistance   - a sign of early diabetes and associated with a 300 % greater risk for   heart attacks, strokes, cancer & Alzheimer type vascular dementia   - All this can be cured  and prevented with losing weight   - get Dr Francis Dowse Fuhrman's book 'the End of Diabetes" and "the End of Dieting" <><><><><><><><><><><><><><><><><><><><><><><><><><><><><><><><><> <><><><><><><><><><><><><><><><><><><><><><><><><><><><><><><><><>  -  PSA - Normal - No Prostate Cancer - Great! <><><><><><><><><><><><><><><><><><><><><><><><><><><><><><><><><>  -  Vitamin D =  62 - Excellent - Please Keep dose same  <><><><><><><><><><><><><><><><><><><><><><><><><><><><><><><><><> <><><><><><><><><><><><><><><><><><><><><><><><><><><><><><><><><>           -

## 2022-02-23 ENCOUNTER — Other Ambulatory Visit: Payer: Self-pay | Admitting: Internal Medicine

## 2022-02-23 ENCOUNTER — Encounter: Payer: Self-pay | Admitting: Internal Medicine

## 2022-02-23 MED ORDER — TIRZEPATIDE 2.5 MG/0.5ML ~~LOC~~ SOAJ: SUBCUTANEOUS | 0 refills | Status: DC

## 2022-03-01 NOTE — Telephone Encounter (Signed)
Can you check into this with Dr Judie Petit

## 2022-03-08 NOTE — Telephone Encounter (Signed)
This pt keeps sending a message regarding medication.  Can you check with Dr. Oneta Rack

## 2022-03-09 ENCOUNTER — Encounter: Payer: Self-pay | Admitting: Internal Medicine

## 2022-03-09 ENCOUNTER — Other Ambulatory Visit: Payer: Self-pay | Admitting: Internal Medicine

## 2022-03-09 DIAGNOSIS — E669 Obesity, unspecified: Secondary | ICD-10-CM

## 2022-03-09 MED ORDER — PHENTERMINE HCL 37.5 MG PO TABS: ORAL_TABLET | ORAL | 1 refills | Status: DC

## 2022-03-09 MED ORDER — TOPIRAMATE 50 MG PO TABS: ORAL_TABLET | ORAL | 1 refills | Status: DC

## 2022-03-14 ENCOUNTER — Encounter: Payer: Self-pay | Admitting: Internal Medicine

## 2022-03-15 ENCOUNTER — Encounter: Payer: Self-pay | Admitting: Internal Medicine

## 2022-04-01 LAB — COLOGUARD: COLOGUARD: NEGATIVE

## 2022-04-01 NOTE — Progress Notes (Signed)
<><><><><><><><><><><><><><><><><><><><><><><><><><><><><><><><><> <><><><><><><><><><><><><><><><><><><><><><><><><><><><><><><><><>  -   Cologard returned Negative - Great !   - Recommendations are to repeat in 3 years - due  Jan 2027    <><><><><><><><><><><><><><><><><><><><><><><><><><><><><><><><><> <><><><><><><><><><><><><><><><><><><><><><><><><><><><><><><><><>  -

## 2022-06-04 ENCOUNTER — Ambulatory Visit: Payer: No Typology Code available for payment source | Admitting: Nurse Practitioner

## 2022-06-06 NOTE — Progress Notes (Unsigned)
     Future Appointments  Date Time Provider Department  06/07/2022 10:30 AM Unk Pinto, MD [facility]  09/04/2022                  6 mo ov  4:00 PM Unk Pinto, MD [facility]  02/26/2023                 cpe  3:00 PM Unk Pinto, MD [facility]    History of Present Illness:      This very nice 62 y.o. MWM with  labile  HTN, HLD, Prediabetes and Vitamin D Deficiency presents with concern of intermittent  dry hacking non-productive cough. Does admit occasional heartburn & reflux.    Current Outpatient Medications on File Prior to Visit  Medication Sig   aspirin 81 MG EC tablet Take \\at  bedtime.   B Complex Vitamins  Take daily.   Calcium -Vit D-Min (CALCIUM 1200 ) Take daily.   VITAMIN   D  Take 10,000 Units daily.   Coenzyme Q10  Take 1 capsule daily.   ferrous fumarate (HEMOCYTE - 106 MG FE) 325 (106 FE) MG TABS tablet Take 1 tablet  once a week.   Multiple Vitamin  Take 1 tablet daily.   OTC  MED [facility]   phentermine (ADIPEX-P) 37.5 MG tablet Take 1/2 to 1 tablet every Morning    rosuvastatin (CRESTOR) 40 MG tablet Take  1 tablet  Daily     topiramate (TOPAMAX) 50 MG tablet Take 1/2 to 1 tablet 2 x /day     No Known Allergies   Problem list He has Essential hypertension; Testosterone deficiency; GERD; OSA on CPAP; Obesity (BMI 36); Vitamin D deficiency; Prediabetes; BMI 36.0-36.9,adult; and COVID on their problem list.   Observations/Objective:  BP 124/78   Pulse 96   Temp 97.6 F (36.4 C)   Resp 16   Ht 5' 10.5" (1.791 m)   Wt 219 lb (99.3 kg)   SpO2 96%   BMI 30.98 kg/m   Forced dry cough. No stridor or wheezing.   HEENT - WNL. Neck - supple.  Chest - Clear equal BS w/o rales , rhonchi or wheezing.  Cor - Nl HS. RRR w/o sig MGR. PP 1(+). No edema. MS- FROM w/o deformities.  Gait Nl. Neuro -  Nl w/o focal abnormalities.   Assessment and Plan:  1. Subacute cough  - DG Chest 2 View; Future  2. Gastroesophageal reflux  disease  - esomeprazole (NEXIUM) 40 MG capsule;  Take  1 capsule  Daily  to Prevent Heartburn &  Acid Reflux   Dispense: 90 capsule; Refill: 3    Follow Up Instructions:        I discussed the assessment and treatment plan with the patient. The patient was provided an opportunity to ask questions and all were answered. The patient agreed with the plan and demonstrated an understanding of the instructions.       The patient was advised to call back or seek an in-person evaluation if the symptoms worsen or if the condition fails to improve as anticipated.    Kirtland Bouchard, MD

## 2022-06-07 ENCOUNTER — Encounter: Payer: Self-pay | Admitting: Internal Medicine

## 2022-06-07 ENCOUNTER — Ambulatory Visit: Payer: No Typology Code available for payment source | Admitting: Internal Medicine

## 2022-06-07 ENCOUNTER — Ambulatory Visit (HOSPITAL_BASED_OUTPATIENT_CLINIC_OR_DEPARTMENT_OTHER)
Admission: RE | Admit: 2022-06-07 | Discharge: 2022-06-07 | Disposition: A | Discharge status: Home / Self Care | Payer: No Typology Code available for payment source | Source: Ambulatory Visit | Attending: Internal Medicine | Admitting: Internal Medicine

## 2022-06-07 VITALS — BP 124/78 | HR 96 | Temp 97.6°F | Resp 16 | Ht 70.5 in | Wt 219.0 lb

## 2022-06-07 DIAGNOSIS — K219 Gastro-esophageal reflux disease without esophagitis: Secondary | ICD-10-CM

## 2022-06-07 DIAGNOSIS — R052 Subacute cough: Secondary | ICD-10-CM

## 2022-06-07 MED ORDER — ESOMEPRAZOLE MAGNESIUM 40 MG PO CPDR: DELAYED_RELEASE_CAPSULE | ORAL | 3 refills | Status: AC

## 2022-06-10 NOTE — Progress Notes (Signed)
-   CXR  is OK   - If cough not significantly improving after starting the Nexium  (esomeprazole),  then the next step is to see  a Pulmonologist Doctor, hospital .

## 2022-06-10 NOTE — Progress Notes (Signed)
-   CXR  is OK  - If cough not significantly improving after starting the Nexium  (esomeprazole),  then the next step is to see  a Pulmonologist Doctor, hospital .

## 2022-06-30 ENCOUNTER — Encounter: Payer: Self-pay | Admitting: Internal Medicine

## 2022-07-19 ENCOUNTER — Other Ambulatory Visit: Payer: Self-pay | Admitting: Internal Medicine

## 2022-07-19 DIAGNOSIS — E782 Mixed hyperlipidemia: Secondary | ICD-10-CM

## 2022-07-26 ENCOUNTER — Ambulatory Visit: Payer: No Typology Code available for payment source | Admitting: Podiatry

## 2022-07-26 ENCOUNTER — Encounter: Payer: Self-pay | Admitting: Podiatry

## 2022-07-26 ENCOUNTER — Ambulatory Visit: Payer: No Typology Code available for payment source

## 2022-07-26 DIAGNOSIS — M21629 Bunionette of unspecified foot: Secondary | ICD-10-CM

## 2022-07-26 DIAGNOSIS — M7751 Other enthesopathy of right foot: Secondary | ICD-10-CM

## 2022-07-26 DIAGNOSIS — D2371 Other benign neoplasm of skin of right lower limb, including hip: Secondary | ICD-10-CM | POA: Diagnosis not present

## 2022-07-26 MED ORDER — DEXAMETHASONE SODIUM PHOSPHATE 120 MG/30ML IJ SOLN
2.0000 mg | Freq: Once | INTRAMUSCULAR | Status: AC
  Administered 2022-07-26: 2 mg via INTRA_ARTICULAR

## 2022-07-27 NOTE — Progress Notes (Signed)
Subjective:  Patient ID: Erik Salas, male    DOB: 05-06-1960,  MRN: [id number] HPI Chief Complaint  Patient presents with   Foot Pain    5th MPJ right - callused area x few months, very tender if pressed, no treatment   New Patient (Initial Visit)    Est pt 2019    62 y.o. male presents with the above complaint.   ROS: Denies fever chills nausea vomit muscle aches pains calf pain back pain chest pain shortness of breath.  Past Medical History:  Diagnosis Date   GERD (gastroesophageal reflux disease)    History of ETT    cardiolite > 10 years ago was normal per patient's report   Hyperlipidemia    Low testosterone    OSA (obstructive sleep apnea)    DOES NOT USE CPAP   Sciatica    Past Surgical History:  Procedure Laterality Date   APPENDECTOMY     CHOLECYSTECTOMY [name] 09/06/2020   Procedure: LAPAROSCOPIC CHOLECYSTECTOMY WITH INTRAOPERATIVE CHOLANGIOGRAM;  Surgeon: Manus Rudd, MD;  Location: [facility];  Service: General;  Laterality: [name];   LEG SURGERY Left    LOWER LEFT    Current Outpatient Medications:    aspirin 81 MG EC tablet, Take 81 mg by mouth at bedtime., Disp: , Rfl:    B Complex Vitamins (VITAMIN B COMPLEX PO), Take by mouth daily., Disp: , Rfl:    Calcium Carbonate-Vit D-Min (CALCIUM 1200 PO), Take by mouth daily., Disp: , Rfl:    Cholecalciferol (VITAMIN D PO), Take 10,000 Units by mouth daily., Disp: , Rfl:    Coenzyme Q10 (CO Q 10 PO), Take 1 capsule by mouth daily., Disp: , Rfl:    esomeprazole (NEXIUM) 40 MG capsule, Take  1 capsule  Daily  to Prevent Heartburn &  Acid Reflux, Disp: 90 capsule, Rfl: 3   ferrous fumarate (HEMOCYTE - 106 MG FE) 325 (106 FE) MG TABS tablet, Take 1 tablet by mouth once a week., Disp: , Rfl:    Multiple Vitamin (MULTIVITAMIN) tablet, Take 1 tablet by mouth daily., Disp: , Rfl:    OVER THE COUNTER MEDICATION, [facility] Prostrate Health, Disp: , Rfl:    phentermine (ADIPEX-P) 37.5 MG tablet, Take 1/2 to 1 tablet every Morning  for Dieting & Weight Loss, Disp: 90 tablet, Rfl: 1   rosuvastatin (CRESTOR) 40 MG tablet, TAKE 1 TABLET BY MOUTH ONCE DAILY FOR CHOLESTEROL, Disp: 90 tablet, Rfl: 0   topiramate (TOPAMAX) 50 MG tablet, Take 1/2 to 1 tablet 2 x /day at Suppertime & Bedtime for Dieting & Weight Loss, Disp: 180 tablet, Rfl: 1  No Known Allergies Review of Systems Objective:  There were no vitals filed for this visit.  General: Well developed, nourished, in no acute distress, alert and oriented x3   Dermatological: Skin is warm, dry and supple bilateral. Nails x 10 are well maintained; remaining integument appears unremarkable at this time. There are no open sores, no preulcerative lesions, no rash or signs of infection present.  Vascular: Dorsalis Pedis artery and Posterior Tibial artery pedal pulses are 2/4 bilateral with immedate capillary fill time. Pedal hair growth present. No varicosities and no lower extremity edema present bilateral.   Neruologic: Grossly intact via light touch bilateral. Vibratory intact via tuning fork bilateral. Protective threshold with Semmes Wienstein monofilament intact to all pedal sites bilateral. Patellar and Achilles deep tendon reflexes 2+ bilateral. No Babinski or clonus noted bilateral.   Musculoskeletal: No gross boney pedal deformities bilateral. No pain,  crepitus, or limitation noted with foot and ankle range of motion bilateral. Muscular strength 5/5 in all groups tested bilateral.  Soft tissue mass plantar aspect of the fifth metatarsal head of the right foot.  He says it is very tender when I put pressure on it.  There is obviously fluctuance there as well.  Benign skin lesion subfifth metatarsal head bilateral.  Gait: Unassisted, Nonantalgic.    Radiographs:   Assessment & Plan:   Assessment: Bursitis fifth metatarsal head with overlying benign skin lesions.  Plan: Injected the bursa today with 3 mg of dexamethasone and local anesthetic and then debrided the  benign skin lesions.     Jeret Goyer T. Story, [place]

## 2022-09-04 ENCOUNTER — Ambulatory Visit (INDEPENDENT_AMBULATORY_CARE_PROVIDER_SITE_OTHER): Payer: No Typology Code available for payment source | Admitting: Internal Medicine

## 2022-09-04 ENCOUNTER — Encounter: Payer: Self-pay | Admitting: Internal Medicine

## 2022-09-04 VITALS — BP 130/68 | HR 92 | Temp 97.8°F | Resp 16 | Ht 70.5 in | Wt 227.4 lb

## 2022-09-04 DIAGNOSIS — R0989 Other specified symptoms and signs involving the circulatory and respiratory systems: Secondary | ICD-10-CM

## 2022-09-04 DIAGNOSIS — R7309 Other abnormal glucose: Secondary | ICD-10-CM

## 2022-09-04 DIAGNOSIS — E782 Mixed hyperlipidemia: Secondary | ICD-10-CM | POA: Diagnosis not present

## 2022-09-04 DIAGNOSIS — Z79899 Other long term (current) drug therapy: Secondary | ICD-10-CM

## 2022-09-04 DIAGNOSIS — Z6833 Body mass index (BMI) 33.0-33.9, adult: Secondary | ICD-10-CM

## 2022-09-04 DIAGNOSIS — E559 Vitamin D deficiency, unspecified: Secondary | ICD-10-CM

## 2022-09-04 DIAGNOSIS — E669 Obesity, unspecified: Secondary | ICD-10-CM

## 2022-09-04 NOTE — Patient Instructions (Signed)

## 2022-09-04 NOTE — Progress Notes (Signed)
Future Appointments  Date Time Provider Department  09/04/2022                     6 mo ov   4:00 PM Lucky Cowboy, MD [facility]  02/26/2023                    cpe  3:00 PM Lucky Cowboy, MD [facility]    History of Present Illness:       This very nice 62 y.o. MWM presents for 6 month follow up with HTN, HLD, Pre-Diabetes and Vitamin D Deficiency.         Patient is treated for HTN  since  & BP has been controlled at home. Today's BP: 130/68. Patient has had no complaints of any cardiac type chest pain, palpitations, dyspnea / orthopnea / PND, dizziness, claudication, or dependent edema.        Hyperlipidemia is controlled with diet & meds. Patient denies myalgias or other med SE's. Last Lipids were  Lab Results  Component Value Date   CHOL 147 02/19/2022   HDL 55 02/19/2022   LDLCALC 58 02/19/2022   TRIG 289 (H) 02/19/2022   CHOLHDL 2.7 02/19/2022      Also, the patient has history of T2_NIDDM PreDiabetes and has had no symptoms of reactive hypoglycemia, diabetic polys, paresthesias or visual blurring.  Last A1c was   Lab Results  Component Value Date   HGBA1C 5.7 (H) 02/19/2022         Further, the patient also has history of Vitamin D Deficiency and supplements vitamin D . Last vitamin D was  Lab Results  Component Value Date   VD25OH 62 02/19/2022      Current Outpatient Medications on File Prior to Visit  Medication Sig   aspirin 81 MG EC tablet Take 81 mg by mouth at bedtime.   B Complex Vitamins (VITAMIN B COMPLEX PO) Take by mouth daily.   Calcium Carbonate-Vit D-Min (CALCIUM 1200 PO) Take by mouth daily.   Cholecalciferol (VITAMIN D PO) Take 10,000 Units by mouth daily.   Coenzyme Q10 (CO Q 10 PO) Take 1 capsule by mouth daily.   esomeprazole (NEXIUM) 40 MG capsule Take  1 capsule  Daily  to Prevent Heartburn &  Acid Reflux   ferrous fumarate (HEMOCYTE - 106 MG FE) 325 (106 FE) MG TABS tablet Take 1 tablet by mouth once a week.    Multiple Vitamin (MULTIVITAMIN) tablet Take 1 tablet by mouth daily.   OVER THE COUNTER MEDICATION Her[employer] Prostrate Health   phentermine (ADIPEX-P) 37.5 MG tablet Take 1/2 to 1 tablet every Morning for Dieting & Weight Loss   rosuvastatin (CRESTOR) 40 MG tablet TAKE 1 TABLET BY MOUTH ONCE DAILY FOR CHOLESTEROL   topiramate (TOPAMAX) 50 MG tablet Take 1/2 to 1 tablet 2 x /day at Suppertime & Bedtime for Dieting & Weight Loss   No current facility-administered medications on file prior to visit.      No Known Allergies    PMHx:   Past Medical History:  Diagnosis Date   GERD (gastroesophageal reflux disease)    History of ETT    cardiolite > 10 years ago was normal per patient's report   Hyperlipidemia    Low testosterone    OSA (obstructive sleep apnea)    DOES NOT USE CPAP   Sciatica       Immunization History  Administered Date(s) Administered   Hepatitis B, ADULT 09/02/2013,  10/01/2013, 01/20/2014   Hepatitis B, PED/ADOLESCENT 09/02/2013, 10/01/2013, 01/20/2014   Influenza Inj Mdck Quad Pf 12/15/2019   Influenza Inj Mdck Quad With Preservative 02/03/2017   Influenza,inj,Quad PF,6+ Mos 01/03/2018, 12/15/2020, 01/03/2022   Influenza,inj,quad, With Preservative 01/03/2018   Influenza-Unspecified 01/03/2018   Moderna Sars-Covid-2 Vaccination 06/22/2019, 07/13/2019   PPD Test 10/27/2013, 12/10/2014, 05/08/2016, 06/17/2017, 11/12/2018, 12/14/2019, 12/15/2020, 02/19/2022   Pneumococcal Polysaccharide-23 01/12/2008, 06/17/2017   Tdap 02/10/2008, 11/12/2018   Zoster Recombinat (Shingrix) 02/24/2021, 04/21/2021      Past Surgical History:  Procedure Laterality Date   APPENDECTOMY     CHOLECYSTECTOMY [name] 09/06/2020   Procedure: LAPAROSCOPIC CHOLECYSTECTOMY WITH INTRAOPERATIVE CHOLANGIOGRAM;  Surgeon: Manus Rudd, MD;  Location: [facility] OR;  Service: General;  Laterality: [name];   LEG SURGERY Left    LOWER LEFT     FHx:    Reviewed / unchanged   SHx:    Reviewed /  unchanged    Systems Review:  Constitutional: Denies fever, chills, wt changes, headaches, insomnia, fatigue, night sweats, change in appetite. Eyes: Denies redness, blurred vision, diplopia, discharge, itchy, watery eyes.  ENT: Denies discharge, congestion, post nasal drip, epistaxis, sore throat, earache, hearing loss, dental pain, tinnitus, vertigo, sinus pain, snoring.  CV: Denies chest pain, palpitations, irregular heartbeat, syncope, dyspnea, diaphoresis, orthopnea, PND, claudication or edema. Respiratory: denies cough, dyspnea, DOE, pleurisy, hoarseness, laryngitis, wheezing.  Gastrointestinal: Denies dysphagia, odynophagia, heartburn, reflux, water brash, abdominal pain or cramps, nausea, vomiting, bloating, diarrhea, constipation, hematemesis, melena, hematochezia  or hemorrhoids. Genitourinary: Denies dysuria, frequency, urgency, nocturia, hesitancy, discharge, hematuria or flank pain. Musculoskeletal: Denies arthralgias, myalgias, stiffness, jt. swelling, pain, limping or strain/sprain.  Skin: Denies pruritus, rash, hives, warts, acne, eczema or change in skin lesion(s). Neuro: No weakness, tremor, incoordination, spasms, paresthesia or pain. Psychiatric: Denies confusion, memory loss or sensory loss. Endo: Denies change in weight, skin or hair change.  Heme/Lymph: No excessive bleeding, bruising or enlarged lymph nodes.   Physical Exam  BP 130/68   Pulse 92   Temp 97.8 F (36.6 C)   Resp 16   Ht 5' 10.5" (1.791 m)   Wt 227 lb 6.4 oz (103.1 kg)   SpO2 96%   BMI 32.17 kg/m   Appears  well nourished, well groomed  and in no distress.  Eyes: PERRLA, EOMs, conjunctiva no swelling or erythema. Sinuses: No frontal/maxillary tenderness ENT/Mouth: EAC's clear, TM's nl w/o erythema, bulging. Nares clear w/o erythema, swelling, exudates. Oropharynx clear without erythema or exudates. Oral hygiene is good. Tongue normal, non obstructing. Hearing intact.  Neck: Supple. Thyroid  not palpable. Car 2+/2+ without bruits, nodes or JVD. Chest: Respirations nl with BS clear & equal w/o rales, rhonchi, wheezing or stridor.  Cor: Heart sounds normal w/ regular rate and rhythm without sig. murmurs, gallops, clicks or rubs. Peripheral pulses normal and equal  without edema.  Abdomen: Soft & bowel sounds normal. Non-tender w/o guarding, rebound, hernias, masses or organomegaly.  Lymphatics: Unremarkable.  Musculoskeletal: Full ROM all peripheral extremities, joint stability, 5/5 strength and normal gait.  Skin: Warm, dry without exposed rashes, lesions or ecchymosis apparent.  Neuro: Cranial nerves intact, reflexes equal bilaterally. Sensory-motor testing grossly intact. Tendon reflexes grossly intact.  Pysch: Alert & oriented x 3.  Insight and judgement nl & appropriate. No ideations.   Assessment and Plan:   1. Labile hypertension  - Continue medication, monitor blood pressure at home.  - Continue DASH diet.  Reminder to go to the ER if any CP,  SOB, nausea, dizziness,  severe HA, changes vision/speech.   - CBC with Differential/Platelet - COMPLETE METABOLIC PANEL WITH GFR - Magnesium - TSH   2. Hyperlipidemia, mixed  - Continue diet/meds, exercise,& lifestyle modifications.  - Continue monitor periodic cholesterol/liver & renal functions    - Lipid panel - TSH   3. Abnormal glucose  - Continue diet, exercise  - Lifestyle modifications.  - Monitor appropriate labs   - Hemoglobin A1c - Insulin, random   4. Vitamin D deficiency  - Continue supplementation  - VITAMIN D 25 Hydroxy    5. Medication management  - CBC with Differential/Platelet - COMPLETE METABOLIC PANEL WITH GFR - Magnesium - Lipid panel - TSH - Hemoglobin A1c - Insulin, random - VITAMIN D 25 Hydroxy    6. Class 1 obesity with serious comorbidity and body mass  index (BMI) of 33.0 to 33.9 in adult, unspecified obesity type  - phentermine (ADIPEX-P) 37.5 MG tablet;  Take  1/2 to 1 tablet every Morning    Dispense: 90 tablet; Refill: 1  - topiramate (TOPAMAX) 50 MG tablet;  Take 1/2 to 1 tablet 2 x /day   Dispense: 180 tablet; Refill: 1          Discussed  regular exercise, BP monitoring, weight control to achieve/maintain BMI less than 25 and discussed med and SE's. Recommended labs to assess /monitor clinical status .  I discussed the assessment and treatment plan with the patient. The patient was provided an opportunity to ask questions and all were answered. The patient agreed with the plan and demonstrated an understanding of the instructions.  I provided over 30 minutes of exam, counseling, chart review and  complex critical decision making.        The patient was advised to call back or seek an in-person evaluation if the symptoms worsen or if the condition fails to improve as anticipated.   Marinus Maw, MD

## 2022-09-05 ENCOUNTER — Encounter: Payer: Self-pay | Admitting: Internal Medicine

## 2022-09-05 LAB — HEMOGLOBIN A1C
Hgb A1c MFr Bld: 5.8 % of total Hgb — ABNORMAL HIGH (ref <5.7)
Mean Plasma Glucose: 120 mg/dL
eAG (mmol/L): 6.6 mmol/L

## 2022-09-05 LAB — LIPID PANEL
Cholesterol: 141 mg/dL (ref <200)
HDL: 51 mg/dL (ref >=40)
LDL Cholesterol (Calc): 64 mg/dL (calc)
Non-HDL Cholesterol (Calc): 90 mg/dL (calc) (ref <130)
Total CHOL/HDL Ratio: 2.8 (calc) (ref <5.0)
Triglycerides: 187 mg/dL — ABNORMAL HIGH (ref <150)

## 2022-09-05 LAB — INSULIN, RANDOM: Insulin: 18.7 u[IU]/mL — ABNORMAL HIGH

## 2022-09-05 LAB — VITAMIN D 25 HYDROXY (VIT D DEFICIENCY, FRACTURES): Vit D, 25-Hydroxy: 78 ng/mL (ref 30–100)

## 2022-09-05 LAB — COMPLETE METABOLIC PANEL WITH GFR
AG Ratio: 1.7 (calc) (ref 1.0–2.5)
ALT: 18 U/L (ref 9–46)
AST: 18 U/L (ref 10–35)
Albumin: 4.5 g/dL (ref 3.6–5.1)
Alkaline phosphatase (APISO): 125 U/L (ref 35–144)
BUN: 20 mg/dL (ref 7–25)
CO2: 28 mmol/L (ref 20–32)
Calcium: 9.6 mg/dL (ref 8.6–10.3)
Chloride: 104 mmol/L (ref 98–110)
Creat: 0.88 mg/dL (ref 0.70–1.35)
Globulin: 2.7 g/dL (calc) (ref 1.9–3.7)
Glucose, Bld: 92 mg/dL (ref 65–99)
Potassium: 4.3 mmol/L (ref 3.5–5.3)
Sodium: 141 mmol/L (ref 135–146)
Total Bilirubin: 0.5 mg/dL (ref 0.2–1.2)
Total Protein: 7.2 g/dL (ref 6.1–8.1)
eGFR: 97 mL/min/{1.73_m2} (ref >=60)

## 2022-09-05 LAB — CBC WITH DIFFERENTIAL/PLATELET
Absolute Monocytes: 848 cells/uL (ref 200–950)
Basophils Absolute: 68 cells/uL (ref 0–200)
Basophils Relative: 0.9 %
Eosinophils Absolute: 150 cells/uL (ref 15–500)
Eosinophils Relative: 2 %
HCT: 43.1 % (ref 38.5–50.0)
Hemoglobin: 14.4 g/dL (ref 13.2–17.1)
Lymphs Abs: 2783 cells/uL (ref 850–3900)
MCH: 30.3 pg (ref 27.0–33.0)
MCHC: 33.4 g/dL (ref 32.0–36.0)
MCV: 90.7 fL (ref 80.0–100.0)
MPV: 12.1 fL (ref 7.5–12.5)
Monocytes Relative: 11.3 %
Neutro Abs: 3653 cells/uL (ref 1500–7800)
Neutrophils Relative %: 48.7 %
Platelets: 163 10*3/uL (ref 140–400)
RBC: 4.75 10*6/uL (ref 4.20–5.80)
RDW: 12.6 % (ref 11.0–15.0)
Total Lymphocyte: 37.1 %
WBC: 7.5 10*3/uL (ref 3.8–10.8)

## 2022-09-05 LAB — TSH: TSH: 1.36 mIU/L (ref 0.40–4.50)

## 2022-09-05 LAB — MAGNESIUM: Magnesium: 2.4 mg/dL (ref 1.5–2.5)

## 2022-09-05 MED ORDER — TOPIRAMATE 50 MG PO TABS
ORAL_TABLET | ORAL | 1 refills | Status: DC
Start: 2022-09-05 — End: 2023-02-25

## 2022-09-05 MED ORDER — PHENTERMINE HCL 37.5 MG PO TABS
ORAL_TABLET | ORAL | 1 refills | Status: DC
Start: 2022-09-05 — End: 2023-02-25

## 2022-09-05 NOTE — Progress Notes (Signed)
^<^<^<^<^<^<^<^<^<^<^<^<^<^<^<^<^<^<^<^<^<^<^<^<^<^<^<^<^<^<^<^<^<^<^<^<^ ^>^>^>^>^>^>^>^>^>^>^>>^>^>^>^>^>^>^>^>^>^>^>^>^>^>^>^>^>^>^>^>^>^>^>^>^>  -  Test results slightly outside the reference range are not unusual. If there is anything important, I will review this with you,  otherwise it is considered normal test values.  If you have further questions,  please do not hesitate to contact me at the office or via My Chart.   ^<^<^<^<^<^<^<^<^<^<^<^<^<^<^<^<^<^<^<^<^<^<^<^<^<^<^<^<^<^<^<^<^<^<^<^<^ ^>^>^>^>^>^>^>^>^>^>^>^>^>^>^>^>^>^>^>^>^>^>^>^>^>^>^>^>^>^>^>^>^>^>^>^>^  -  Chol = 141  & LDL = 64 -   Both  Excellent   - Very low risk for Heart Attack  / Stroke  ^>^>^>^>^>^>^>^>^>^>^>^>^>^>^>^>^>^>^>^>^>^>^>^>^>^>^>^>^>^>^>^>^>^>^>^>^ ^>^>^>^>^>^>^>^>^>^>^>^>^>^>^>^>^>^>^>^>^>^>^>^>^>^>^>^>^>^>^>^>^>^>^>^>^  -  A1c - 5.8% - borderline elevated Blood sugars . So,    - Avoid Sweets, Candy & White Stuff   - White Rice, White [place], [place] Flour  - Breads &  Pasta ^>^>^>^>^>^>^>^>^>^>^>^>^>^>^>^>^>^>^>^>^>^>^>^>^>^>^>^>^>^>^>^>^>^>^>^>^  -  Vitamin D = 78 - Excellent   ^>^>^>^>^>^>^>^>^>^>^>^>^>^>^>^>^>^>^>^>^>^>^>^>^>^>^>^>^>^>^>^>^>^>^>^>^  - All Else - CBC - Kidneys - Electrolytes - Liver - Magnesium & Thyroid    - all  Normal / OK ^>^>^>^>^>^>^>^>^>^>^>^>^>^>^>^>^>^>^>^>^>^>^>^>^>^>^>^>^>^>^>^>^>^>^>^>^  -  Keep up the Great Work  !  ^>^>^>^>^>^>^>^>^>^>^>^>^>^>^>^>^>^>^>^>^>^>^>^>^>^>^>^>^>^>^>^>^>^>^>^>^

## 2022-12-02 ENCOUNTER — Other Ambulatory Visit: Payer: Self-pay | Admitting: Nurse Practitioner

## 2022-12-02 DIAGNOSIS — E782 Mixed hyperlipidemia: Secondary | ICD-10-CM

## 2022-12-04 ENCOUNTER — Other Ambulatory Visit: Payer: Self-pay

## 2022-12-04 ENCOUNTER — Ambulatory Visit (INDEPENDENT_AMBULATORY_CARE_PROVIDER_SITE_OTHER): Payer: No Typology Code available for payment source | Admitting: Nurse Practitioner

## 2022-12-04 ENCOUNTER — Encounter: Payer: Self-pay | Admitting: Nurse Practitioner

## 2022-12-04 VITALS — BP 118/72 | HR 112 | Temp 98.1°F | Ht 70.5 in | Wt 220.0 lb

## 2022-12-04 DIAGNOSIS — R52 Pain, unspecified: Secondary | ICD-10-CM | POA: Diagnosis not present

## 2022-12-04 DIAGNOSIS — Z1152 Encounter for screening for COVID-19: Secondary | ICD-10-CM | POA: Diagnosis not present

## 2022-12-04 DIAGNOSIS — G44209 Tension-type headache, unspecified, not intractable: Secondary | ICD-10-CM | POA: Diagnosis not present

## 2022-12-04 DIAGNOSIS — R6889 Other general symptoms and signs: Secondary | ICD-10-CM

## 2022-12-04 DIAGNOSIS — R5081 Fever presenting with conditions classified elsewhere: Secondary | ICD-10-CM

## 2022-12-04 LAB — POCT INFLUENZA A/B
Influenza A, POC: NEGATIVE
Influenza B, POC: NEGATIVE

## 2022-12-04 LAB — POC COVID19 BINAXNOW: SARS Coronavirus 2 Ag: NEGATIVE

## 2022-12-04 MED ORDER — PREDNISONE 10 MG PO TABS: ORAL_TABLET | ORAL | 0 refills | Status: DC

## 2022-12-04 NOTE — Progress Notes (Signed)
Assessment and Plan:  Erik Salas was seen today for an episodic visit.  Diagnoses and all order for this visit:  Flu-like symptoms Negative  - POCT Influenza A/B  Encounter for screening for COVID-19 Negative  - POC COVID-19  Tension headache/body aches/fever Continue OTC Tylenol - may take up to 1,000 mg up to QID Start prednisone taper as directed Push fluids  Orders Placed This Encounter  Procedures   POC COVID-19    Order Specific Question:   Previously tested for COVID-19    Answer:   No    Order Specific Question:   Resident in a congregate (group) care setting    Answer:   No    Order Specific Question:   Employed in healthcare setting    Answer:   No   POCT Influenza A/B   Meds ordered this encounter  Medications   predniSONE (DELTASONE) 10 MG tablet    Sig: 1 tab 3 x day for 2 days, then 1 tab 2 x day for 2 days, then 1 tab 1 x day for 3 days    Dispense:  13 tablet    Refill:  0    Order Specific Question:   Supervising Provider    Answer:   Lucky Cowboy [phone]   Notify office for further evaluation and treatment, questions or concerns if s/s fail to improve. The risks and benefits of my recommendations, as well as other treatment options were discussed with the patient today. Questions were answered.  Further disposition pending results of labs. Discussed med's effects and SE's.    Over 15 minutes of exam, counseling, chart review, and critical decision making was performed.   Future Appointments  Date Time Provider Department Center  02/26/2023  3:00 PM Lucky Cowboy, MD [facility] None    ------------------------------------------------------------------------------------------------------------------   HPI BP 118/72   Pulse (!) 112   Temp 98.1 F (36.7 C)   Ht 5' 10.5" (1.791 m)   Wt 220 lb (99.8 kg)   SpO2 95%   BMI 31.12 kg/m    Patient complains of symptoms of a URI. Symptoms include achiness, fever 102.5 F T Max, and headache  described as throbbing . Onset of symptoms was 3 days ago, and has been unchanged since that time. Treatment to date: antihistamines, cough suppressants, and decongestants.  Denies any exposures, difficulty breathing, shortness of breath, neck pain.    Past Medical History:  Diagnosis Date   GERD (gastroesophageal reflux disease)    History of ETT    cardiolite > 10 years ago was normal per patient's report   Hyperlipidemia    Low testosterone    OSA (obstructive sleep apnea)    DOES NOT USE CPAP   Sciatica      No Known Allergies  Current Outpatient Medications on File Prior to Visit  Medication Sig   aspirin 81 MG EC tablet Take 81 mg by mouth at bedtime.   B Complex Vitamins (VITAMIN B COMPLEX PO) Take by mouth daily.   Calcium Carbonate-Vit D-Min (CALCIUM 1200 PO) Take by mouth daily.   Cholecalciferol (VITAMIN D PO) Take 10,000 Units by mouth daily.   Coenzyme Q10 (CO Q 10 PO) Take 1 capsule by mouth daily.   esomeprazole (NEXIUM) 40 MG capsule Take  1 capsule  Daily  to Prevent Heartburn &  Acid Reflux   ferrous fumarate (HEMOCYTE - 106 MG FE) 325 (106 FE) MG TABS tablet Take 1 tablet by mouth once a week.   Multiple Vitamin (  MULTIVITAMIN) tablet Take 1 tablet by mouth daily.   OVER THE COUNTER MEDICATION [employer] Prostrate Health   rosuvastatin (CRESTOR) 40 MG tablet TAKE 1 TABLET BY MOUTH ONCE DAILY FOR CHOLESTEROL   phentermine (ADIPEX-P) 37.5 MG tablet Take 1/2 to 1 tablet every Morning for Dieting & Weight Loss (Patient not taking: Reported on 12/04/2022)   topiramate (TOPAMAX) 50 MG tablet Take 1/2 to 1 tablet 2 x /day at Suppertime & Bedtime for Dieting & Weight Loss (Patient not taking: Reported on 12/04/2022)   No current facility-administered medications on file prior to visit.    ROS: all negative except what is noted in the HPI.   Physical Exam:  BP 118/72   Pulse (!) 112   Temp 98.1 F (36.7 C)   Ht 5' 10.5" (1.791 m)   Wt 220 lb (99.8 kg)   SpO2 95%    BMI 31.12 kg/m   General Appearance: NAD.  Awake, conversant and cooperative. Eyes: PERRLA, EOMs intact.  Sclera white.  Conjunctiva without erythema. Sinuses: No frontal/maxillary tenderness.  No nasal discharge. Nares patent.  ENT/Mouth: Ext aud canals clear.  Bilateral TMs w/DOL and without erythema or bulging. Hearing intact.  Posterior pharynx without swelling or exudate.  Tonsils without swelling or erythema.  Neck: Supple.  No masses, nodules or thyromegaly. Respiratory: Effort is regular with non-labored breathing. Breath sounds are equal bilaterally without rales, rhonchi, wheezing or stridor.  Cardio: RRR with no MRGs. Brisk peripheral pulses without edema.  Abdomen: Active BS in all four quadrants.  Soft and non-tender without guarding, rebound tenderness, hernias or masses. Lymphatics: Non tender without lymphadenopathy.  Musculoskeletal: Full ROM, 5/5 strength, normal ambulation.  No clubbing or cyanosis. Skin: Appropriate color for ethnicity. Warm without rashes, lesions, ecchymosis, ulcers.  Neuro: CN II-XII grossly normal. Normal muscle tone without cerebellar symptoms and intact sensation.   Psych: AO X 3,  appropriate mood and affect, insight and judgment.     Adela Glimpse, NP 10:45 AM [facility]

## 2022-12-04 NOTE — Patient Instructions (Signed)

## 2023-01-03 ENCOUNTER — Ambulatory Visit: Payer: No Typology Code available for payment source

## 2023-02-25 ENCOUNTER — Encounter: Payer: Self-pay | Admitting: Internal Medicine

## 2023-02-25 NOTE — Patient Instructions (Signed)

## 2023-02-25 NOTE — Progress Notes (Unsigned)
[place]      ADULT   &   ADOLESCENT      INTERNAL MEDICINE  Erik Salas, M.D.          Erik Salas, Erik Salas        Adela Glimpse, FNP  [facility] [address]  [place], [place]. [address] Telephone [phone] Telefax [phone]   Annual  Screening/Preventative Visit  & Comprehensive Evaluation & Examination  Future Appointments  Date Time Provider Department  02/26/2023                     cpe  3:00 PM Erik Cowboy, MD [facility]  03/19/2024                     cpe  2:00 PM Erik Cowboy, MD [facility]            This very nice 62 y.o. MWM with  labile  HTN, HLD, Prediabetes and Vitamin D Deficiency presents for a Screening /Preventative Visit & comprehensive evaluation and management of multiple medical co-morbidities.   Patient had hx/o OSA cured after a 35 # weight loss. Patient has hx/o GERD controlled on diet .        Patient is followed expectantly  for Labile HTN predates since 2013. Patient's BP has been controlled at home.  Today's BP is at goal - 118/74 . Patient denies any cardiac symptoms as chest pain, palpitations, shortness of breath, dizziness or ankle swelling.        Patient's hyperlipidemia is controlled with diet and Rosuvastatin. Patient denies myalgias or other medication SE's. Last lipids were at goal:  Lab Results  Component Value Date   CHOL 141 09/04/2022   HDL 51 09/04/2022   LDLCALC 64 09/04/2022   TRIG 187 (H) 09/04/2022   CHOLHDL 2.8 09/04/2022                                                     Patient has hx/o Testosterone Deficiency ("220" /2012) and declined treatment.        Patient has hx/o prediabetes (A1c 5.7% /2009) and patient denies reactive hypoglycemic symptoms, visual blurring, diabetic polys or paresthesias. Last A1c was normal  & at goal:  Lab Results  Component Value Date   HGBA1C 5.8 (H) 09/04/2022         Finally, patient has history of Vitamin D Deficiency ("39" /2016)  and last vitamin D was at goal :   Lab Results  Component Value Date   VD25OH 78 09/04/2022       Current Outpatient Medications  Medication Instructions   aspirin EC    81 mg Daily at bedtime,     VITAMIN B COMPLEX Daily   Calcium 1200 mg -Vit D-Min  Daily   VITAMIN D  10,000 Units,  Daily   CO Q 10  1 capsule Daily   esomeprazole 40 MG capsule Take  1 capsule  Daily    HEMOCYTE - 106 MG FE 325  1 tablet  Weekly   Multiple Vitamin  1 tablet Daily   Herbal Life Prostrate Health     rosuvastatin  40 MG tablet TAKE 1 TABLET  DAILY     No Known Allergies    Past Medical History:  Diagnosis Date   GERD (gastroesophageal  reflux disease)    History of ETT    cardiolite > 10 years ago was normal per patient's report   Hyperlipidemia    Low testosterone    OSA (obstructive sleep apnea)    DOES NOT USE CPAP   Sciatica    Health Maintenance  Topic Date Due   Zoster Vaccines- Shingrix (1 of 2) Never done   FOOT EXAM  10/28/2014   HEMOGLOBIN A1C  06/13/2020   OPHTHALMOLOGY EXAM  07/29/2020   INFLUENZA VACCINE  10/10/2020   URINE MICROALBUMIN  12/13/2020   TETANUS/TDAP  11/11/2028   Hepatitis C Screening  Completed   HIV Screening  Completed   HPV VACCINES  Aged Out    Immunization History  Administered Date(s) Administered   Hepatitis B 09/02/2013, 10/01/2013, 01/20/2014   Influenza 01/03/2018   Moderna Sars-Covid-2 Vacc 06/22/2019, 07/13/2019   PPD Test 06/17/2017, 11/12/2018, 12/14/2019   Pneumococcal -23 01/12/2008, 06/17/2017   Tdap 02/10/2008, 11/12/2018    Last Colon - 06/2009 - Dr Kinnie Scales  Cologard - 03/20/2022 - Negative - > Repeat in 3 years - due Jan 2027   Past Surgical History:  Procedure Laterality Date   APPENDECTOMY     CHOLECYSTECTOMY [name] 09/06/2020   Procedure: LAPAROSCOPIC CHOLECYSTECTOMY WITH INTRAOPERATIVE CHOLANGIOGRAM;  Surgeon: Manus Rudd, MD;  Location: [facility] OR;  Service: General;  Laterality: [name];   LEG SURGERY Left    LOWER LEFT      Family History  Problem Relation Age of Onset   Heart attack Father 35   Heart attack Brother 61   Heart attack Sister 35     Social History   Occupation: Research officer, trade union for [employer]   Tobacco Use   Smoking status: Former    Types: Cigarettes    Quit date: 03/12/1986    Years since quitting: 34.7   Smokeless tobacco: Never  Substance Use Topics   Alcohol use: Yes    Alcohol/week: 0.0 standard drinks    Comment: very rarely   Drug use: Never      ROS Constitutional: Denies fever, chills, weight loss/gain, headaches, insomnia,  night sweats or change in appetite. Does c/o fatigue. Eyes: Denies redness, blurred vision, diplopia, discharge, itchy or watery eyes.  ENT: Denies discharge, congestion, post nasal drip, epistaxis, sore throat, earache, hearing loss, dental pain, Tinnitus, Vertigo, Sinus pain or snoring.  Cardio: Denies chest pain, palpitations, irregular heartbeat, syncope, dyspnea, diaphoresis, orthopnea, PND, claudication or edema Respiratory: denies cough, dyspnea, DOE, pleurisy, hoarseness, laryngitis or wheezing.  Gastrointestinal: Denies dysphagia, heartburn, reflux, water brash, pain, cramps, nausea, vomiting, bloating, diarrhea, constipation, hematemesis, melena, hematochezia, jaundice or hemorrhoids Genitourinary: Denies dysuria, frequency, urgency, nocturia, hesitancy, discharge, hematuria or flank pain Musculoskeletal: Denies arthralgia, myalgia, stiffness, Jt. Swelling, pain, limp or strain/sprain. Denies Falls. Skin: Denies puritis, rash, hives, warts, acne, eczema or change in skin lesion Neuro: No weakness, tremor, incoordination, spasms, paresthesia or pain Psychiatric: Denies confusion, memory loss or sensory loss. Denies Depression. Endocrine: Denies change in weight, skin, hair change, nocturia, and paresthesia, diabetic polys, visual blurring or hyper / hypo glycemic episodes.  Heme/Lymph: No excessive bleeding, bruising or enlarged  lymph nodes.   Physical Exam  BP 118/74   Pulse 100   Temp 97.9 F (36.6 C)   Resp 16   Ht 5' 10.5" (1.791 m)   Wt 227 lb 6.4 oz (103.1 kg)   SpO2 96%   BMI 32.17 kg/m   General Appearance: Well nourished and well groomed and in no  apparent distress.  Eyes: PERRLA, EOMs, conjunctiva no swelling or erythema, normal fundi and vessels. Sinuses: No frontal/maxillary tenderness ENT/Mouth: EACs patent / TMs  nl. Nares clear without erythema, swelling, mucoid exudates. Oral hygiene is good. No erythema, swelling, or exudate. Tongue normal, non-obstructing. Tonsils not swollen or erythematous. Hearing normal.  Neck: Supple, thyroid not palpable. No bruits, nodes or JVD. Respiratory: Respiratory effort normal.  BS equal and clear bilateral without rales, rhonci, wheezing or stridor. Cardio: Heart sounds are normal with regular rate and rhythm and no murmurs, rubs or gallops. Peripheral pulses are normal and equal bilaterally without edema. No aortic or femoral bruits. Chest: symmetric with normal excursions and percussion.  Abdomen: Soft, with Nl bowel sounds. Nontender, no guarding, rebound, hernias, masses, or organomegaly.  Lymphatics: Non tender without lymphadenopathy.  Musculoskeletal: Full ROM all peripheral extremities, joint stability, 5/5 strength, and normal gait. Skin: Warm and dry without rashes, lesions, cyanosis, clubbing or  ecchymosis.  Neuro: Cranial nerves intact, reflexes equal bilaterally. Normal muscle tone, no cerebellar symptoms. Sensation intact.  Pysch: Alert and oriented X 3 with normal affect, insight and judgment appropriate.   Assessment and Plan   1. Annual Preventative/Screening Exam    2. Labile hypertension  - EKG 12-Lead - [place], RETROPERITNL ABD,  LTD - Urinalysis, Routine w reflex microscopic - Microalbumin / creatinine urine ratio - CBC with Differential/Platelet - COMPLETE METABOLIC PANEL WITH GFR - Magnesium - TSH   3. Hyperlipidemia,  mixed  - EKG 12-Lead - [place], RETROPERITNL ABD,  LTD - Lipid panel - TSH   4. Abnormal glucose  - EKG 12-Lead - [place], RETROPERITNL ABD,  LTD - Hemoglobin A1c - Insulin, random   5. Vitamin D deficiency  - VITAMIN D 25 Hydroxy    6. Prostate cancer screening  - PSA   7. Screening for colorectal cancer  - POC Hemoccult Bld/Stl    8. Testosterone deficiency   9. Screening-pulmonary TB  - TB Skin Test   10. Screening for heart disease  - EKG 12-Lead   11. FHx: heart disease  - EKG 12-Lead - [place], RETROPERITNL ABD,  LTD   12. Former smoker  - EKG 12-Lead - [place], RETROPERITNL ABD,  LTD   13. Screening for AAA (aortic abdominal aneurysm)  - [place], RETROPERITNL ABD,  LTD   14. Fatigue  - Vitamin B12 - Iron, TIBC and Ferritin Panel - Testosterone - CBC with Differential/Platelet - TSH   15. Medication management  - Urinalysis, Routine w reflex microscopic - Microalbumin / creatinine urine ratio - CBC with Differential/Platelet - COMPLETE METABOLIC PANEL WITH GFR - Magnesium - Lipid panel - TSH - Hemoglobin A1c - Insulin, random - VITAMIN D 25 Hydroxy        Patient was counseled in prudent diet, weight control to achieve/maintain BMI less than 25, BP monitoring, regular exercise and medications as discussed.  Discussed med effects and SE's. Routine screening labs and tests as requested with regular follow-up as recommended. Over 40 minutes of exam, counseling, chart review and high complex critical decision making was performed   Marinus Maw, MD

## 2023-02-26 ENCOUNTER — Ambulatory Visit (INDEPENDENT_AMBULATORY_CARE_PROVIDER_SITE_OTHER): Payer: No Typology Code available for payment source | Admitting: Internal Medicine

## 2023-02-26 VITALS — BP 118/74 | HR 100 | Temp 97.9°F | Resp 16 | Ht 70.5 in | Wt 227.4 lb

## 2023-02-26 DIAGNOSIS — R7309 Other abnormal glucose: Secondary | ICD-10-CM

## 2023-02-26 DIAGNOSIS — Z0000 Encounter for general adult medical examination without abnormal findings: Secondary | ICD-10-CM | POA: Diagnosis not present

## 2023-02-26 DIAGNOSIS — Z130 Encounter for screening for diseases of the blood and blood-forming organs and certain disorders involving the immune mechanism: Secondary | ICD-10-CM

## 2023-02-26 DIAGNOSIS — I700 Atherosclerosis of aorta: Secondary | ICD-10-CM

## 2023-02-26 DIAGNOSIS — Z111 Encounter for screening for respiratory tuberculosis: Secondary | ICD-10-CM

## 2023-02-26 DIAGNOSIS — Z1389 Encounter for screening for other disorder: Secondary | ICD-10-CM | POA: Diagnosis not present

## 2023-02-26 DIAGNOSIS — R5383 Other fatigue: Secondary | ICD-10-CM

## 2023-02-26 DIAGNOSIS — E559 Vitamin D deficiency, unspecified: Secondary | ICD-10-CM

## 2023-02-26 DIAGNOSIS — Z131 Encounter for screening for diabetes mellitus: Secondary | ICD-10-CM | POA: Diagnosis not present

## 2023-02-26 DIAGNOSIS — R350 Frequency of micturition: Secondary | ICD-10-CM

## 2023-02-26 DIAGNOSIS — Z0001 Encounter for general adult medical examination with abnormal findings: Secondary | ICD-10-CM

## 2023-02-26 DIAGNOSIS — E782 Mixed hyperlipidemia: Secondary | ICD-10-CM

## 2023-02-26 DIAGNOSIS — E349 Endocrine disorder, unspecified: Secondary | ICD-10-CM

## 2023-02-26 DIAGNOSIS — Z1329 Encounter for screening for other suspected endocrine disorder: Secondary | ICD-10-CM | POA: Diagnosis not present

## 2023-02-26 DIAGNOSIS — Z79899 Other long term (current) drug therapy: Secondary | ICD-10-CM

## 2023-02-26 DIAGNOSIS — Z1211 Encounter for screening for malignant neoplasm of colon: Secondary | ICD-10-CM

## 2023-02-26 DIAGNOSIS — Z13220 Encounter for screening for lipoid disorders: Secondary | ICD-10-CM

## 2023-02-26 DIAGNOSIS — Z87891 Personal history of nicotine dependence: Secondary | ICD-10-CM

## 2023-02-26 DIAGNOSIS — R0989 Other specified symptoms and signs involving the circulatory and respiratory systems: Secondary | ICD-10-CM

## 2023-02-26 DIAGNOSIS — Z8249 Family history of ischemic heart disease and other diseases of the circulatory system: Secondary | ICD-10-CM

## 2023-02-26 DIAGNOSIS — Z125 Encounter for screening for malignant neoplasm of prostate: Secondary | ICD-10-CM

## 2023-02-26 DIAGNOSIS — N401 Enlarged prostate with lower urinary tract symptoms: Secondary | ICD-10-CM | POA: Diagnosis not present

## 2023-02-26 DIAGNOSIS — Z136 Encounter for screening for cardiovascular disorders: Secondary | ICD-10-CM

## 2023-02-27 ENCOUNTER — Encounter: Payer: Self-pay | Admitting: Internal Medicine

## 2023-02-27 LAB — CBC WITH DIFFERENTIAL/PLATELET
Absolute Lymphocytes: 2667 {cells}/uL (ref 850–3900)
Absolute Monocytes: 707 {cells}/uL (ref 200–950)
Basophils Absolute: 42 {cells}/uL (ref 0–200)
Basophils Relative: 0.6 %
Eosinophils Absolute: 189 {cells}/uL (ref 15–500)
Eosinophils Relative: 2.7 %
HCT: 44.5 % (ref 38.5–50.0)
Hemoglobin: 14.6 g/dL (ref 13.2–17.1)
MCH: 29.7 pg (ref 27.0–33.0)
MCHC: 32.8 g/dL (ref 32.0–36.0)
MCV: 90.4 fL (ref 80.0–100.0)
MPV: 12.4 fL (ref 7.5–12.5)
Monocytes Relative: 10.1 %
Neutro Abs: 3395 {cells}/uL (ref 1500–7800)
Neutrophils Relative %: 48.5 %
Platelets: 162 10*3/uL (ref 140–400)
RBC: 4.92 10*6/uL (ref 4.20–5.80)
RDW: 13.7 % (ref 11.0–15.0)
Total Lymphocyte: 38.1 %
WBC: 7 10*3/uL (ref 3.8–10.8)

## 2023-02-27 LAB — COMPLETE METABOLIC PANEL WITH GFR
AG Ratio: 1.5 (calc) (ref 1.0–2.5)
ALT: 18 U/L (ref 9–46)
AST: 19 U/L (ref 10–35)
Albumin: 4.1 g/dL (ref 3.6–5.1)
Alkaline phosphatase (APISO): 148 U/L — ABNORMAL HIGH (ref 35–144)
BUN: 20 mg/dL (ref 7–25)
CO2: 28 mmol/L (ref 20–32)
Calcium: 9.5 mg/dL (ref 8.6–10.3)
Chloride: 103 mmol/L (ref 98–110)
Creat: 1.02 mg/dL (ref 0.70–1.35)
Globulin: 2.7 g/dL (ref 1.9–3.7)
Glucose, Bld: 104 mg/dL — ABNORMAL HIGH (ref 65–99)
Potassium: 4 mmol/L (ref 3.5–5.3)
Sodium: 140 mmol/L (ref 135–146)
Total Bilirubin: 0.6 mg/dL (ref 0.2–1.2)
Total Protein: 6.8 g/dL (ref 6.1–8.1)
eGFR: 82 mL/min/{1.73_m2} (ref >=60)

## 2023-02-27 LAB — IRON,TIBC AND FERRITIN PANEL
%SAT: 19 % — ABNORMAL LOW (ref 20–48)
Ferritin: 55 ng/mL (ref 24–380)
Iron: 62 ug/dL (ref 50–180)
TIBC: 320 ug/dL (ref 250–425)

## 2023-02-27 LAB — URINALYSIS, ROUTINE W REFLEX MICROSCOPIC
Bilirubin Urine: NEGATIVE
Glucose, UA: NEGATIVE
Hgb urine dipstick: NEGATIVE
Ketones, ur: NEGATIVE
Leukocytes,Ua: NEGATIVE
Nitrite: NEGATIVE
Protein, ur: NEGATIVE
Specific Gravity, Urine: 1.02 (ref 1.001–1.035)
pH: 5.5 (ref 5.0–8.0)

## 2023-02-27 LAB — PSA: PSA: 0.7 ng/mL (ref <=4.00)

## 2023-02-27 LAB — LIPID PANEL
Cholesterol: 159 mg/dL (ref <200)
HDL: 45 mg/dL (ref >=40)
LDL Cholesterol (Calc): 79 mg/dL
Non-HDL Cholesterol (Calc): 114 mg/dL (ref <130)
Total CHOL/HDL Ratio: 3.5 (calc) (ref <5.0)
Triglycerides: 264 mg/dL — ABNORMAL HIGH (ref <150)

## 2023-02-27 LAB — HEMOGLOBIN A1C
Hgb A1c MFr Bld: 5.6 %{Hb} (ref <5.7)
Mean Plasma Glucose: 114 mg/dL
eAG (mmol/L): 6.3 mmol/L

## 2023-02-27 LAB — MICROALBUMIN / CREATININE URINE RATIO
Creatinine, Urine: 120 mg/dL (ref 20–320)
Microalb, Ur: <0.2 mg/dL

## 2023-02-27 LAB — MAGNESIUM: Magnesium: 2.2 mg/dL (ref 1.5–2.5)

## 2023-02-27 LAB — VITAMIN B12: Vitamin B-12: 1114 pg/mL — ABNORMAL HIGH (ref 200–1100)

## 2023-02-27 LAB — TESTOSTERONE: Testosterone: 263 ng/dL (ref 250–827)

## 2023-02-27 LAB — VITAMIN D 25 HYDROXY (VIT D DEFICIENCY, FRACTURES): Vit D, 25-Hydroxy: 68 ng/mL (ref 30–100)

## 2023-02-27 LAB — TSH: TSH: 1.33 m[IU]/L (ref 0.40–4.50)

## 2023-02-27 LAB — INSULIN, RANDOM: Insulin: 72.6 u[IU]/mL — ABNORMAL HIGH

## 2023-02-27 NOTE — Progress Notes (Signed)
[][][][][][][][][][][][][][][][][][][][][][][][][][][][][][][][][][][][][][][][][]][][][][][][][][][][][][][][][][][][][][][][][[][][][][] [][][][][][][][][][][][][][][][][][][][][][][][][][][][][][][][][][][][][][][][][]][][][][][][][][][][][][][][][][][][][][][][][[][][][][] -  Test results slightly outside the reference range are not unusual. If there is anything important, I will review this with you,  otherwise it is considered normal test values.  If you have further questions,  please do not hesitate to contact me at the office or via My Chart.  [] [] [] [] [] [] [] [] [] [] [] [] [] [] [] [] [] [] [] [] [] [] [] [] [] [] [] [] [] [] [] [] [] [] [] [] [] [] [] [] [] ][] [] [] [] [] [] [] [] [] [] [] [] [] [] [] [] [] [] [] [] [] [] [[] [] [] [] []  [] [] [] [] [] [] [] [] [] [] [] [] [] [] [] [] [] [] [] [] [] [] [] [] [] [] [] [] [] [] [] [] [] [] [] [] [] [] [] [] [] ][] [] [] [] [] [] [] [] [] [] [] [] [] [] [] [] [] [] [] [] [] [] [[] [] [] [] []   -  Vitamin B12 level is very high , so you may decrease your B supplement to every other day  [] [] [] [] [] [] [] [] [] [] [] [] [] [] [] [] [] [] [] [] [] [] [] [] [] [] [] [] [] [] [] [] [] [] [] [] [] [] [] [] [] ][] [] [] [] [] [] [] [] [] [] [] [] [] [] [] [] [] [] [] [] [] [] [[] [] [] [] []   - Iron levels are OK  [] [] [] [] [] [] [] [] [] [] [] [] [] [] [] [] [] [] [] [] [] [] [] [] [] [] [] [] [] [] [] [] [] [] [] [] [] [] [] [] [] ][] [] [] [] [] [] [] [] [] [] [] [] [] [] [] [] [] [] [] [] [] [] [[] [] [] [] []   - Chol = 159 -  Excellent   - Very low risk for Heart Attack  / Stroke [] [] [] [] [] [] [] [] [] [] [] [] [] [] [] [] [] [] [] [] [] [] [] [] [] [] [] [] [] [] [] [] [] [] [] [] [] [] [] [] [] ][] [] [] [] [] [] [] [] [] [] [] [] [] [] [] [] [] [] [] [] [] [] [[] [] [] [] []   - But  Triglycerides ( = 264     ) or fats in blood are too high                 (   Ideal or  Goal is less than 150  !  )    - Recommend avoid fried & greasy foods,  sweets / candy,   - Avoid white rice  (brown or wild rice or Quinoa is OK),   - Avoid white potatoes  (sweet potatoes are OK)   - Avoid anything made from white flour  - bagels, doughnuts, rolls, buns, biscuits, white and   wheat breads, pizza crust and traditional  pasta made of white flour & egg  white  - (vegetarian pasta or spinach or wheat pasta is OK).    - Multi-grain bread is OK - like multi-grain flat bread or  sandwich thins.   - Avoid alcohol in excess.   - Exercise is also important. [] [] [] [] [] [] [] [] [] [] [] [] [] [] [] [] [] [] [] [] [] [] [] [] [] [] [] [] [] [] [] [] [] [] [] [] [] [] [] [] [] ][] [] [] [] [] [] [] [] [] [] [] [] [] [] [] [] [] [] [] [] [] [] [[] [] [] [] []   - PSA - very low - No Prostate cancer   - Great !  [] [] [] [] [] [] [] [] [] [] [] [] [] [] [] [] [] [] [] [] [] [] [] [] [] [] [] [] [] [] [] [] [] [] [] [] [] [] [] [] [] ][] [] [] [] [] [] [] [] [] [] [] [] [] [] [] [] [] [] [] [] [] [] [[] [] [] [] []   - A1c = 5.6% - back to Normal nonDiabetic range - Great !  [] [] [] [] [] [] [] [] [] [] [] [] [] [] [] [] [] [] [] [] [] [] [] [] [] [] [] [] [] [] [] [] [] [] [] [] [] [] [] [] [] ][] [] [] [] [] [] [] [] [] [] [] [] [] [] [] [] [] [] [] [] [] [] [[] [] [] [] []   -  Vitamin D = 68 - Excellent - Please keep dosage same  [] [] [] [] [] [] [] [] [] [] [] [] [] [] [] [] [] [] [] [] [] [] [] [] [] [] [] [] [] [] [] [] [] [] [] [] [] [] [] [] [] ][] [] [] [] [] [] [] [] [] [] [] [] [] [] [] [] [] [] [] [] [] [] [[] [] [] [] []   - All Else - CBC - Kidneys - Electrolytes - Liver - Magnesium & Thyroid    - all  Normal / OK [] [] [] [] [] [] [] [] [] [] [] [] [] [] [] [] [] [] [] [] [] [] [] [] [] [] [] [] [] [] [] [] [] [] [] [] [] [] [] [] [] ][] [] [] [] [] [] [] [] [] [] [] [] [] [] [] [] [] [] [] [] [] [] [[] [] [] [] []

## 2023-04-22 ENCOUNTER — Other Ambulatory Visit: Payer: Self-pay | Admitting: Nurse Practitioner

## 2023-04-22 DIAGNOSIS — E782 Mixed hyperlipidemia: Secondary | ICD-10-CM

## 2023-04-23 ENCOUNTER — Other Ambulatory Visit: Payer: Self-pay

## 2023-04-23 DIAGNOSIS — E782 Mixed hyperlipidemia: Secondary | ICD-10-CM

## 2023-04-23 MED ORDER — ROSUVASTATIN CALCIUM 40 MG PO TABS: ORAL_TABLET | ORAL | 0 refills | Status: AC

## 2023-04-29 ENCOUNTER — Encounter: Payer: Self-pay | Admitting: Internal Medicine

## 2023-05-30 ENCOUNTER — Ambulatory Visit: Payer: No Typology Code available for payment source | Admitting: Nurse Practitioner

## 2023-06-15 ENCOUNTER — Encounter: Payer: Self-pay | Admitting: Internal Medicine

## 2023-09-03 ENCOUNTER — Ambulatory Visit: Payer: No Typology Code available for payment source | Admitting: Internal Medicine

## 2023-12-05 ENCOUNTER — Ambulatory Visit: Payer: No Typology Code available for payment source | Admitting: Nurse Practitioner

## 2024-03-19 ENCOUNTER — Encounter: Payer: No Typology Code available for payment source | Admitting: Internal Medicine
